# Patient Record
Sex: Male | Born: 1950 | Race: White | Hispanic: No | Marital: Married | Smoking: Never smoker
Health system: Southern US, Community
[De-identification: ages and names within clinical notes are randomized; demographics above are authoritative.]

## PROBLEM LIST (undated history)

## (undated) DIAGNOSIS — R29898 Other symptoms and signs involving the musculoskeletal system: Secondary | ICD-10-CM

## (undated) DIAGNOSIS — N400 Enlarged prostate without lower urinary tract symptoms: Secondary | ICD-10-CM

## (undated) DIAGNOSIS — I10 Essential (primary) hypertension: Secondary | ICD-10-CM

## (undated) DIAGNOSIS — C801 Malignant (primary) neoplasm, unspecified: Secondary | ICD-10-CM

## (undated) DIAGNOSIS — Z950 Presence of cardiac pacemaker: Secondary | ICD-10-CM

## (undated) DIAGNOSIS — R55 Syncope and collapse: Secondary | ICD-10-CM

## (undated) HISTORY — PX: INSERT / REPLACE / REMOVE PACEMAKER: SUR710

## (undated) HISTORY — PX: CARDIAC SURGERY: SHX584

## (undated) HISTORY — PX: HERNIA REPAIR: SHX51

## (undated) HISTORY — PX: OTHER SURGICAL HISTORY: SHX169

---

## 2007-06-04 ENCOUNTER — Ambulatory Visit: Payer: Self-pay | Admitting: Gastroenterology

## 2014-07-23 DIAGNOSIS — H2513 Age-related nuclear cataract, bilateral: Secondary | ICD-10-CM | POA: Diagnosis not present

## 2014-08-26 DIAGNOSIS — Z125 Encounter for screening for malignant neoplasm of prostate: Secondary | ICD-10-CM | POA: Diagnosis not present

## 2014-08-26 DIAGNOSIS — R7309 Other abnormal glucose: Secondary | ICD-10-CM | POA: Diagnosis not present

## 2014-08-26 DIAGNOSIS — I10 Essential (primary) hypertension: Secondary | ICD-10-CM | POA: Diagnosis not present

## 2014-09-01 DIAGNOSIS — L570 Actinic keratosis: Secondary | ICD-10-CM | POA: Diagnosis not present

## 2014-09-01 DIAGNOSIS — D485 Neoplasm of uncertain behavior of skin: Secondary | ICD-10-CM | POA: Diagnosis not present

## 2014-09-01 DIAGNOSIS — L218 Other seborrheic dermatitis: Secondary | ICD-10-CM | POA: Diagnosis not present

## 2014-09-01 DIAGNOSIS — Z85828 Personal history of other malignant neoplasm of skin: Secondary | ICD-10-CM | POA: Diagnosis not present

## 2014-09-01 DIAGNOSIS — L821 Other seborrheic keratosis: Secondary | ICD-10-CM | POA: Diagnosis not present

## 2014-09-02 DIAGNOSIS — Z86010 Personal history of colonic polyps: Secondary | ICD-10-CM | POA: Diagnosis not present

## 2014-09-02 DIAGNOSIS — I10 Essential (primary) hypertension: Secondary | ICD-10-CM | POA: Diagnosis not present

## 2014-09-02 DIAGNOSIS — Z9181 History of falling: Secondary | ICD-10-CM | POA: Diagnosis not present

## 2014-09-02 DIAGNOSIS — R7309 Other abnormal glucose: Secondary | ICD-10-CM | POA: Diagnosis not present

## 2014-09-02 DIAGNOSIS — Z1389 Encounter for screening for other disorder: Secondary | ICD-10-CM | POA: Diagnosis not present

## 2014-09-02 DIAGNOSIS — M542 Cervicalgia: Secondary | ICD-10-CM | POA: Diagnosis not present

## 2015-01-23 DIAGNOSIS — Z09 Encounter for follow-up examination after completed treatment for conditions other than malignant neoplasm: Secondary | ICD-10-CM | POA: Diagnosis not present

## 2015-01-23 DIAGNOSIS — Z86010 Personal history of colonic polyps: Secondary | ICD-10-CM | POA: Diagnosis not present

## 2015-01-23 DIAGNOSIS — D123 Benign neoplasm of transverse colon: Secondary | ICD-10-CM | POA: Diagnosis not present

## 2015-01-23 DIAGNOSIS — D126 Benign neoplasm of colon, unspecified: Secondary | ICD-10-CM | POA: Diagnosis not present

## 2015-01-23 DIAGNOSIS — D122 Benign neoplasm of ascending colon: Secondary | ICD-10-CM | POA: Diagnosis not present

## 2015-01-23 DIAGNOSIS — K5730 Diverticulosis of large intestine without perforation or abscess without bleeding: Secondary | ICD-10-CM | POA: Diagnosis not present

## 2015-01-23 DIAGNOSIS — D124 Benign neoplasm of descending colon: Secondary | ICD-10-CM | POA: Diagnosis not present

## 2015-03-27 DIAGNOSIS — I10 Essential (primary) hypertension: Secondary | ICD-10-CM | POA: Diagnosis not present

## 2015-03-27 DIAGNOSIS — R7303 Prediabetes: Secondary | ICD-10-CM | POA: Diagnosis not present

## 2015-04-01 DIAGNOSIS — R7303 Prediabetes: Secondary | ICD-10-CM | POA: Diagnosis not present

## 2015-04-01 DIAGNOSIS — Z5329 Procedure and treatment not carried out because of patient's decision for other reasons: Secondary | ICD-10-CM | POA: Diagnosis not present

## 2015-04-01 DIAGNOSIS — Z9189 Other specified personal risk factors, not elsewhere classified: Secondary | ICD-10-CM | POA: Diagnosis not present

## 2015-04-01 DIAGNOSIS — I10 Essential (primary) hypertension: Secondary | ICD-10-CM | POA: Diagnosis not present

## 2015-04-01 DIAGNOSIS — Z9181 History of falling: Secondary | ICD-10-CM | POA: Diagnosis not present

## 2015-04-01 DIAGNOSIS — Z23 Encounter for immunization: Secondary | ICD-10-CM | POA: Diagnosis not present

## 2015-04-01 DIAGNOSIS — Z1389 Encounter for screening for other disorder: Secondary | ICD-10-CM | POA: Diagnosis not present

## 2015-04-01 DIAGNOSIS — Z139 Encounter for screening, unspecified: Secondary | ICD-10-CM | POA: Diagnosis not present

## 2015-04-15 DIAGNOSIS — L570 Actinic keratosis: Secondary | ICD-10-CM | POA: Diagnosis not present

## 2015-04-15 DIAGNOSIS — D1801 Hemangioma of skin and subcutaneous tissue: Secondary | ICD-10-CM | POA: Diagnosis not present

## 2015-04-15 DIAGNOSIS — Z85828 Personal history of other malignant neoplasm of skin: Secondary | ICD-10-CM | POA: Diagnosis not present

## 2015-04-15 DIAGNOSIS — L821 Other seborrheic keratosis: Secondary | ICD-10-CM | POA: Diagnosis not present

## 2015-04-15 DIAGNOSIS — D045 Carcinoma in situ of skin of trunk: Secondary | ICD-10-CM | POA: Diagnosis not present

## 2015-04-29 DIAGNOSIS — D045 Carcinoma in situ of skin of trunk: Secondary | ICD-10-CM | POA: Diagnosis not present

## 2015-10-01 DIAGNOSIS — I10 Essential (primary) hypertension: Secondary | ICD-10-CM | POA: Diagnosis not present

## 2015-10-01 DIAGNOSIS — Z125 Encounter for screening for malignant neoplasm of prostate: Secondary | ICD-10-CM | POA: Diagnosis not present

## 2015-10-01 DIAGNOSIS — R7303 Prediabetes: Secondary | ICD-10-CM | POA: Diagnosis not present

## 2015-10-07 DIAGNOSIS — Z9189 Other specified personal risk factors, not elsewhere classified: Secondary | ICD-10-CM | POA: Diagnosis not present

## 2015-10-07 DIAGNOSIS — R7303 Prediabetes: Secondary | ICD-10-CM | POA: Diagnosis not present

## 2015-10-07 DIAGNOSIS — I10 Essential (primary) hypertension: Secondary | ICD-10-CM | POA: Diagnosis not present

## 2015-10-07 DIAGNOSIS — Z5329 Procedure and treatment not carried out because of patient's decision for other reasons: Secondary | ICD-10-CM | POA: Diagnosis not present

## 2015-10-07 DIAGNOSIS — Z6828 Body mass index (BMI) 28.0-28.9, adult: Secondary | ICD-10-CM | POA: Diagnosis not present

## 2015-11-03 DIAGNOSIS — Z85828 Personal history of other malignant neoplasm of skin: Secondary | ICD-10-CM | POA: Diagnosis not present

## 2015-11-03 DIAGNOSIS — L570 Actinic keratosis: Secondary | ICD-10-CM | POA: Diagnosis not present

## 2015-11-03 DIAGNOSIS — L821 Other seborrheic keratosis: Secondary | ICD-10-CM | POA: Diagnosis not present

## 2015-12-17 DIAGNOSIS — Z6828 Body mass index (BMI) 28.0-28.9, adult: Secondary | ICD-10-CM | POA: Diagnosis not present

## 2015-12-17 DIAGNOSIS — M545 Low back pain: Secondary | ICD-10-CM | POA: Diagnosis not present

## 2016-04-08 DIAGNOSIS — I10 Essential (primary) hypertension: Secondary | ICD-10-CM | POA: Diagnosis not present

## 2016-04-11 ENCOUNTER — Encounter: Payer: Self-pay | Admitting: Emergency Medicine

## 2016-04-11 ENCOUNTER — Emergency Department
Admission: EM | Admit: 2016-04-11 | Discharge: 2016-04-11 | Disposition: A | Discharge status: Home / Self Care | Payer: PPO | Attending: Emergency Medicine | Admitting: Emergency Medicine

## 2016-04-11 DIAGNOSIS — S0083XA Contusion of other part of head, initial encounter: Secondary | ICD-10-CM | POA: Diagnosis not present

## 2016-04-11 DIAGNOSIS — Y9389 Activity, other specified: Secondary | ICD-10-CM | POA: Diagnosis not present

## 2016-04-11 DIAGNOSIS — Y990 Civilian activity done for income or pay: Secondary | ICD-10-CM | POA: Insufficient documentation

## 2016-04-11 DIAGNOSIS — R55 Syncope and collapse: Secondary | ICD-10-CM | POA: Diagnosis not present

## 2016-04-11 DIAGNOSIS — Z79899 Other long term (current) drug therapy: Secondary | ICD-10-CM | POA: Diagnosis not present

## 2016-04-11 DIAGNOSIS — I10 Essential (primary) hypertension: Secondary | ICD-10-CM | POA: Diagnosis not present

## 2016-04-11 DIAGNOSIS — W07XXXA Fall from chair, initial encounter: Secondary | ICD-10-CM | POA: Insufficient documentation

## 2016-04-11 DIAGNOSIS — S0990XA Unspecified injury of head, initial encounter: Secondary | ICD-10-CM | POA: Diagnosis not present

## 2016-04-11 DIAGNOSIS — Y929 Unspecified place or not applicable: Secondary | ICD-10-CM | POA: Diagnosis not present

## 2016-04-11 HISTORY — DX: Essential (primary) hypertension: I10

## 2016-04-11 LAB — BASIC METABOLIC PANEL
Anion gap: 11 (ref 5–15)
BUN: 18 mg/dL (ref 6–20)
CALCIUM: 9.7 mg/dL (ref 8.9–10.3)
CO2: 24 mmol/L (ref 22–32)
CREATININE: 0.95 mg/dL (ref 0.61–1.24)
Chloride: 97 mmol/L — ABNORMAL LOW (ref 101–111)
GFR calc Af Amer: >60 mL/min (ref >60)
GFR calc non Af Amer: >60 mL/min (ref >60)
GLUCOSE: 156 mg/dL — AB (ref 65–99)
Potassium: 3.7 mmol/L (ref 3.5–5.1)
Sodium: 132 mmol/L — ABNORMAL LOW (ref 135–145)

## 2016-04-11 LAB — CBC
HCT: 43 % (ref 40.0–52.0)
Hemoglobin: 15.1 g/dL (ref 13.0–18.0)
MCH: 33.9 pg (ref 26.0–34.0)
MCHC: 35.2 g/dL (ref 32.0–36.0)
MCV: 96.4 fL (ref 80.0–100.0)
PLATELETS: 243 10*3/uL (ref 150–440)
RBC: 4.46 MIL/uL (ref 4.40–5.90)
RDW: 13 % (ref 11.5–14.5)
WBC: 12.8 10*3/uL — ABNORMAL HIGH (ref 3.8–10.6)

## 2016-04-11 NOTE — ED Triage Notes (Signed)
Patient was sitting at his computer desk, vision got "blurred", hit key pad with head and fell to floor.  Abrasion to forehead.

## 2016-04-11 NOTE — ED Provider Notes (Signed)
[facility] Emergency Department Provider Note  ____________________________________________  Time seen: Approximately 4:43 PM  I have reviewed the triage vital signs and the nursing notes.   HISTORY  Chief Complaint Loss of Consciousness    HPI Cody Gonzales is a 65 y.o. male who is in his usual state of health when today around noon he had turned in his chair to work on his computer and suddenly his vision got blurry and he got lightheaded and passed out. He fell from the chair, hitting his forehead on the keyboard tray in the process, woke up after just a few seconds. He denies any preceding symptoms other than the blurry vision. No severe headache chest pain back pain abdominal pain or shortness of breath. Pain afterward. Denies any headache or vision changes right now. No neck pain numbness tingling or weakness. He has a history of syncope from blood draws or elbow injuries past. Has a follow-up with  his primary care doctor in 2 days.     Past Medical History:  Diagnosis Date  . Hypertension      There are no active problems to display for this patient.    Past Surgical History:  Procedure Laterality Date  . CARDIAC SURGERY    . carpel tunnel       Prior to Admission medications   Medication Sig Start Date End Date Taking? Authorizing Provider  losartan-hydrochlorothiazide (HYZAAR) 100-25 MG tablet Take 1 tablet by mouth every morning. 04/07/16  Yes Historical Provider, MD     Allergies Review of patient's allergies indicates no known allergies.   No family history on file.  Social History Social History  Substance Use Topics  . Smoking status: Never Smoker  . Smokeless tobacco: Never Used  . Alcohol use Yes     Comment: weekly    Review of Systems  Constitutional:   No fever or chills.  ENT:   No sore throat. No rhinorrhea. Cardiovascular:   No chest pain. Respiratory:   No dyspnea or cough. Gastrointestinal:   Negative for  abdominal pain, vomiting and diarrhea.   Musculoskeletal:   Negative for focal pain or swelling Neurological:   Negative for headaches 10-point ROS otherwise negative.  ____________________________________________   PHYSICAL EXAM:  VITAL SIGNS: ED Triage Vitals  Enc Vitals Group     BP 04/11/16 1335 140/76     Pulse Rate 04/11/16 1335 88     Resp 04/11/16 1335 18     Temp 04/11/16 1335 98.5 F (36.9 C)     Temp Source 04/11/16 1335 Oral     SpO2 04/11/16 1335 98 %     Weight 04/11/16 1336 175 lb (79.4 kg)     Height 04/11/16 1336 5\' 6"  (1.676 m)     Head Circumference --      Peak Flow --      Pain Score 04/11/16 1349 1     Pain Loc --      Pain Edu? --      Excl. in [place]? --     Vital signs reviewed, nursing assessments reviewed.   Constitutional:   Alert and oriented. Well appearing and in no distress. Eyes:   No scleral icterus. No conjunctival pallor. PERRL. EOMI.  No nystagmus. ENT   Head:   Normocephalic with midforehead contusion. No laceration..   Nose:   No congestion/rhinnorhea. No septal hematoma   Mouth/Throat:   MMM, no pharyngeal erythema. No peritonsillar mass.    Neck:   No stridor.  No SubQ emphysema. No meningismus. Hematological/Lymphatic/Immunilogical:   No cervical lymphadenopathy. Cardiovascular:   RRR. Symmetric bilateral radial and DP pulses.  No murmurs.  Respiratory:   Normal respiratory effort without tachypnea nor retractions. Breath sounds are clear and equal bilaterally. No wheezes/rales/rhonchi. Gastrointestinal:   Soft and nontender. Non distended. There is no CVA tenderness.  No rebound, rigidity, or guarding. Genitourinary:   deferred Musculoskeletal:   Nontender with normal range of motion in all extremities. No joint effusions.  No lower extremity tenderness.  No edema. Neurologic:   Normal speech and language.  CN 2-10 normal. Motor grossly intact. No gross focal neurologic deficits are appreciated.  Skin:    Skin is  warm, dry and intact. No rash noted.  No petechiae, purpura, or bullae.  ____________________________________________    LABS (pertinent positives/negatives) (all labs ordered are listed, but only abnormal results are displayed) Labs Reviewed  BASIC METABOLIC PANEL - Abnormal; Notable for the following:       Result Value   Sodium 132 (*)    Chloride 97 (*)    Glucose, Bld 156 (*)    All other components within normal limits  CBC - Abnormal; Notable for the following:    WBC 12.8 (*)    All other components within normal limits  URINALYSIS COMPLETEWITH MICROSCOPIC ([facility] ONLY)  CBG MONITORING, ED   ____________________________________________   EKG  Interpreted by me Sinus rhythm rate of 82, left axis. First-degree AV block with PR interval 226 ms. Right bundle branch block. Normal ST segments and T waves. No acute ischemic changes.  ____________________________________________    RADIOLOGY    ____________________________________________   PROCEDURES Procedures  ____________________________________________   INITIAL IMPRESSION / ASSESSMENT AND PLAN / ED COURSE  Pertinent labs & imaging results that were available during my care of the patient were reviewed by me and considered in my medical decision making (see chart for details).  Patient well appearing no acute distress. Presents with syncope, likely vasovagal. No red flag symptoms before or afterward. Normal vital signs, unremarkable EKG. Not on blood thinners. No severe comorbidities. Follow-up with primary care as scheduled in 2 days. May be related to mild dehydration as he has been working in the yard for the last couple days aerating and seeding the lawn. No symptoms with exertion.  Considering the patient's symptoms, medical history, and physical examination today, I have low suspicion for ischemic stroke, intracranial hemorrhage, meningitis, encephalitis, carotid or vertebral dissection, venous sinus  thrombosis, MS, intracranial hypertension, glaucoma, CRAO, CRVO, or temporal arteritis. Low suspicion of AAA dissection PE ACS   Clinical Course   ____________________________________________   FINAL CLINICAL IMPRESSION(S) / ED DIAGNOSES  Final diagnoses:  Vasovagal syncope  Forehead contusion     Portions of this note were generated with dragon dictation software. Dictation errors may occur despite best attempts at proofreading.    Carrie Mew, MD 04/11/16 [phone]

## 2016-04-13 DIAGNOSIS — I10 Essential (primary) hypertension: Secondary | ICD-10-CM | POA: Diagnosis not present

## 2016-04-13 DIAGNOSIS — R7303 Prediabetes: Secondary | ICD-10-CM | POA: Diagnosis not present

## 2016-04-13 DIAGNOSIS — Z6828 Body mass index (BMI) 28.0-28.9, adult: Secondary | ICD-10-CM | POA: Diagnosis not present

## 2016-04-13 DIAGNOSIS — R55 Syncope and collapse: Secondary | ICD-10-CM | POA: Diagnosis not present

## 2016-04-13 DIAGNOSIS — Z23 Encounter for immunization: Secondary | ICD-10-CM | POA: Diagnosis not present

## 2016-04-20 DIAGNOSIS — Z23 Encounter for immunization: Secondary | ICD-10-CM | POA: Diagnosis not present

## 2016-04-20 DIAGNOSIS — Z9181 History of falling: Secondary | ICD-10-CM | POA: Diagnosis not present

## 2016-04-20 DIAGNOSIS — Z1389 Encounter for screening for other disorder: Secondary | ICD-10-CM | POA: Diagnosis not present

## 2016-04-20 DIAGNOSIS — R001 Bradycardia, unspecified: Secondary | ICD-10-CM | POA: Diagnosis not present

## 2016-04-20 DIAGNOSIS — I10 Essential (primary) hypertension: Secondary | ICD-10-CM | POA: Diagnosis not present

## 2016-04-20 DIAGNOSIS — E663 Overweight: Secondary | ICD-10-CM | POA: Diagnosis not present

## 2016-04-20 DIAGNOSIS — R55 Syncope and collapse: Secondary | ICD-10-CM | POA: Diagnosis not present

## 2016-04-29 DIAGNOSIS — E782 Mixed hyperlipidemia: Secondary | ICD-10-CM | POA: Insufficient documentation

## 2016-04-29 DIAGNOSIS — I10 Essential (primary) hypertension: Secondary | ICD-10-CM | POA: Diagnosis not present

## 2016-04-29 DIAGNOSIS — R55 Syncope and collapse: Secondary | ICD-10-CM | POA: Diagnosis not present

## 2016-05-04 DIAGNOSIS — R55 Syncope and collapse: Secondary | ICD-10-CM | POA: Diagnosis not present

## 2016-05-06 DIAGNOSIS — J069 Acute upper respiratory infection, unspecified: Secondary | ICD-10-CM | POA: Diagnosis not present

## 2016-05-06 DIAGNOSIS — J209 Acute bronchitis, unspecified: Secondary | ICD-10-CM | POA: Diagnosis not present

## 2016-05-17 DIAGNOSIS — Z85828 Personal history of other malignant neoplasm of skin: Secondary | ICD-10-CM | POA: Diagnosis not present

## 2016-05-17 DIAGNOSIS — L821 Other seborrheic keratosis: Secondary | ICD-10-CM | POA: Diagnosis not present

## 2016-05-17 DIAGNOSIS — L570 Actinic keratosis: Secondary | ICD-10-CM | POA: Diagnosis not present

## 2016-05-25 DIAGNOSIS — R55 Syncope and collapse: Secondary | ICD-10-CM | POA: Diagnosis not present

## 2016-05-26 DIAGNOSIS — E782 Mixed hyperlipidemia: Secondary | ICD-10-CM | POA: Diagnosis not present

## 2016-05-26 DIAGNOSIS — I10 Essential (primary) hypertension: Secondary | ICD-10-CM | POA: Diagnosis not present

## 2016-05-26 DIAGNOSIS — R55 Syncope and collapse: Secondary | ICD-10-CM | POA: Diagnosis not present

## 2016-06-09 DIAGNOSIS — H35342 Macular cyst, hole, or pseudohole, left eye: Secondary | ICD-10-CM | POA: Diagnosis not present

## 2016-06-09 DIAGNOSIS — H11153 Pinguecula, bilateral: Secondary | ICD-10-CM | POA: Diagnosis not present

## 2016-06-09 DIAGNOSIS — H40013 Open angle with borderline findings, low risk, bilateral: Secondary | ICD-10-CM | POA: Diagnosis not present

## 2016-06-09 DIAGNOSIS — H40003 Preglaucoma, unspecified, bilateral: Secondary | ICD-10-CM | POA: Diagnosis not present

## 2016-06-09 DIAGNOSIS — H18413 Arcus senilis, bilateral: Secondary | ICD-10-CM | POA: Diagnosis not present

## 2016-06-09 DIAGNOSIS — Z961 Presence of intraocular lens: Secondary | ICD-10-CM | POA: Diagnosis not present

## 2016-06-09 DIAGNOSIS — H5213 Myopia, bilateral: Secondary | ICD-10-CM | POA: Diagnosis not present

## 2016-06-09 DIAGNOSIS — H52223 Regular astigmatism, bilateral: Secondary | ICD-10-CM | POA: Diagnosis not present

## 2016-06-28 DIAGNOSIS — R001 Bradycardia, unspecified: Secondary | ICD-10-CM | POA: Diagnosis not present

## 2016-06-28 DIAGNOSIS — R55 Syncope and collapse: Secondary | ICD-10-CM | POA: Diagnosis not present

## 2016-06-28 DIAGNOSIS — R42 Dizziness and giddiness: Secondary | ICD-10-CM | POA: Diagnosis not present

## 2016-07-03 ENCOUNTER — Inpatient Hospital Stay: Payer: PPO

## 2016-07-03 ENCOUNTER — Inpatient Hospital Stay
Admission: EM | Admit: 2016-07-03 | Discharge: 2016-07-05 | DRG: 244 | Disposition: A | Discharge status: Home / Self Care | Payer: PPO | Attending: Internal Medicine | Admitting: Internal Medicine

## 2016-07-03 ENCOUNTER — Encounter: Payer: Self-pay | Admitting: *Deleted

## 2016-07-03 ENCOUNTER — Emergency Department: Payer: PPO

## 2016-07-03 DIAGNOSIS — I442 Atrioventricular block, complete: Principal | ICD-10-CM | POA: Diagnosis present

## 2016-07-03 DIAGNOSIS — R55 Syncope and collapse: Secondary | ICD-10-CM | POA: Diagnosis not present

## 2016-07-03 DIAGNOSIS — I445 Left posterior fascicular block: Secondary | ICD-10-CM | POA: Diagnosis not present

## 2016-07-03 DIAGNOSIS — I10 Essential (primary) hypertension: Secondary | ICD-10-CM | POA: Diagnosis present

## 2016-07-03 DIAGNOSIS — Z950 Presence of cardiac pacemaker: Secondary | ICD-10-CM | POA: Diagnosis not present

## 2016-07-03 DIAGNOSIS — R001 Bradycardia, unspecified: Secondary | ICD-10-CM | POA: Diagnosis not present

## 2016-07-03 DIAGNOSIS — Z7982 Long term (current) use of aspirin: Secondary | ICD-10-CM | POA: Diagnosis not present

## 2016-07-03 DIAGNOSIS — I4510 Unspecified right bundle-branch block: Secondary | ICD-10-CM | POA: Diagnosis present

## 2016-07-03 DIAGNOSIS — Z8679 Personal history of other diseases of the circulatory system: Secondary | ICD-10-CM

## 2016-07-03 DIAGNOSIS — R0789 Other chest pain: Secondary | ICD-10-CM | POA: Diagnosis not present

## 2016-07-03 HISTORY — DX: Syncope and collapse: R55

## 2016-07-03 LAB — COMPREHENSIVE METABOLIC PANEL
ALT: 31 U/L (ref 17–63)
AST: 26 U/L (ref 15–41)
Albumin: 3.8 g/dL (ref 3.5–5.0)
Alkaline Phosphatase: 67 U/L (ref 38–126)
Anion gap: 6 (ref 5–15)
BILIRUBIN TOTAL: 1.2 mg/dL (ref 0.3–1.2)
BUN: 17 mg/dL (ref 6–20)
CALCIUM: 8.4 mg/dL — AB (ref 8.9–10.3)
CHLORIDE: 109 mmol/L (ref 101–111)
CO2: 25 mmol/L (ref 22–32)
CREATININE: 0.69 mg/dL (ref 0.61–1.24)
Glucose, Bld: 102 mg/dL — ABNORMAL HIGH (ref 65–99)
Potassium: 3.3 mmol/L — ABNORMAL LOW (ref 3.5–5.1)
Sodium: 140 mmol/L (ref 135–145)
TOTAL PROTEIN: 6.7 g/dL (ref 6.5–8.1)

## 2016-07-03 LAB — GLUCOSE, CAPILLARY: GLUCOSE-CAPILLARY: 104 mg/dL — AB (ref 65–99)

## 2016-07-03 LAB — TROPONIN I

## 2016-07-03 MED ORDER — AMLODIPINE BESYLATE 5 MG PO TABS
5.0000 mg | ORAL_TABLET | Freq: Two times a day (BID) | ORAL | Status: DC | PRN
  Administered 2016-07-04: 5 mg via ORAL
  Filled 2016-07-03: qty 1

## 2016-07-03 MED ORDER — ACETAMINOPHEN 325 MG PO TABS: 650.0000 mg | ORAL_TABLET | Freq: Four times a day (QID) | ORAL | Status: DC | PRN

## 2016-07-03 MED ORDER — LOSARTAN POTASSIUM 25 MG PO TABS
100.0000 mg | ORAL_TABLET | Freq: Every day | ORAL | Status: DC
  Administered 2016-07-05: 100 mg via ORAL
  Filled 2016-07-03: qty 4

## 2016-07-03 MED ORDER — SODIUM CHLORIDE 0.9% FLUSH: 3.0000 mL | INTRAVENOUS | Status: DC | PRN

## 2016-07-03 MED ORDER — SODIUM CHLORIDE 0.9 % IV SOLN: 250.0000 mL | INTRAVENOUS | Status: DC | PRN

## 2016-07-03 MED ORDER — ONDANSETRON HCL 4 MG PO TABS: 4.0000 mg | ORAL_TABLET | Freq: Four times a day (QID) | ORAL | Status: DC | PRN

## 2016-07-03 MED ORDER — HYDROCHLOROTHIAZIDE 25 MG PO TABS
25.0000 mg | ORAL_TABLET | Freq: Every day | ORAL | Status: DC
  Administered 2016-07-05: 25 mg via ORAL
  Filled 2016-07-03: qty 1

## 2016-07-03 MED ORDER — CHLORHEXIDINE GLUCONATE 4 % EX LIQD
60.0000 mL | Freq: Once | CUTANEOUS | Status: AC
  Administered 2016-07-04: 4 via TOPICAL

## 2016-07-03 MED ORDER — SODIUM CHLORIDE 0.9% FLUSH
3.0000 mL | Freq: Two times a day (BID) | INTRAVENOUS | Status: DC
  Administered 2016-07-03 – 2016-07-04 (×3): 3 mL via INTRAVENOUS

## 2016-07-03 MED ORDER — ONDANSETRON HCL 4 MG/2ML IJ SOLN: 4.0000 mg | Freq: Four times a day (QID) | INTRAMUSCULAR | Status: DC | PRN

## 2016-07-03 MED ORDER — ASPIRIN EC 81 MG PO TBEC
81.0000 mg | DELAYED_RELEASE_TABLET | Freq: Every day | ORAL | Status: DC
  Administered 2016-07-05: 81 mg via ORAL
  Filled 2016-07-03: qty 1

## 2016-07-03 MED ORDER — CHLORHEXIDINE GLUCONATE 4 % EX LIQD
60.0000 mL | Freq: Once | CUTANEOUS | Status: AC
Start: 2016-07-03 — End: 2016-07-03
  Administered 2016-07-03: 4 via TOPICAL

## 2016-07-03 MED ORDER — ACETAMINOPHEN 650 MG RE SUPP: 650.0000 mg | Freq: Four times a day (QID) | RECTAL | Status: DC | PRN

## 2016-07-03 MED ORDER — LOSARTAN POTASSIUM-HCTZ 100-25 MG PO TABS: 1.0000 | ORAL_TABLET | ORAL | Status: DC

## 2016-07-03 NOTE — ED Notes (Signed)
ICU called for bed readiness. A patient is being moved out of the room and it will be cleaned before this patient can be admitted.

## 2016-07-03 NOTE — ED Notes (Addendum)
Patient was raised up for chest x-ray and lost capture again. Patient c/o dizzy when heart rate is in the 30's during loss of capture.

## 2016-07-03 NOTE — ED Triage Notes (Signed)
Patient has c/o several episodes of syncope today and has a history of bradycardia. Patient has seen a cardiologist.

## 2016-07-03 NOTE — ED Notes (Signed)
Patient is consistently being paced at this time. Dr. Clayton Bibles is aware.

## 2016-07-03 NOTE — ED Notes (Signed)
Patient's head was raised to 30 degrees. Patient sat up and keeping right leg straight moved up on the stretcher. Patient's denies pain, lost capture on monitor. Dr. Clayton Bibles was paged.

## 2016-07-03 NOTE — ED Provider Notes (Signed)
[facility] Emergency Department Provider Note ____________________________________________   I have reviewed the triage vital signs and the triage nursing note.  HISTORY  Chief Complaint Bradycardia   Historian Somewhat limited due to critical illness, history from wife and Patient  HPI Cody Gonzales is a 66 y.o. male on Hyzaar for hypertension, presenting today after approximately 4 syncopal episodes today. He gets lightheaded and then passed out. No chest pain. No trouble breathing. States that he's been having "issues" with his heart and has been following with Dr. Nehemiah Massed.  No ongoing chest pain or trouble breathing. No recent fevers.    Past Medical History:  Diagnosis Date  . Hypertension   . Syncope     There are no active problems to display for this patient.   Past Surgical History:  Procedure Laterality Date  . CARDIAC SURGERY    . carpel tunnel      Prior to Admission medications   Medication Sig Start Date End Date Taking? Authorizing Provider  losartan-hydrochlorothiazide (HYZAAR) 100-25 MG tablet Take 1 tablet by mouth every morning. 04/07/16   Historical Provider, MD    No Known Allergies  No family history on file.  Social History Social History  Substance Use Topics  . Smoking status: Never Smoker  . Smokeless tobacco: Never Used  . Alcohol use Yes     Comment: weekly    Review of Systems  Constitutional: Negative for fever. Eyes: Negative for visual changes. ENT: Negative for sore throat. Cardiovascular: Negative for chest pain. Respiratory: Negative for shortness of breath. Gastrointestinal: Negative for abdominal pain, vomiting and diarrhea. Genitourinary: Negative for dysuria. Musculoskeletal: Negative for back pain. Skin: Negative for rash. Neurological: Negative for headache. 10 point Review of Systems otherwise negative ____________________________________________   PHYSICAL EXAM:  VITAL SIGNS: ED  Triage Vitals  Enc Vitals Group     BP 07/03/16 1758 107/83     Pulse Rate 07/03/16 1758 (!) 30     Resp 07/03/16 1758 16     Temp 07/03/16 1758 97.8 F (36.6 C)     Temp Source 07/03/16 1758 Oral     SpO2 07/03/16 1758 98 %     Weight 07/03/16 1807 175 lb (79.4 kg)     Height 07/03/16 1807 5\' 6"  (1.676 m)     Head Circumference --      Peak Flow --      Pain Score --      Pain Loc --      Pain Edu? --      Excl. in [place]? --      Constitutional: Alert and oriented. Well appearing and in no distress. HEENT   Head: Normocephalic and atraumatic.      Eyes: Conjunctivae are normal. PERRL. Normal extraocular movements.      Ears:         Nose: No congestion/rhinnorhea.   Mouth/Throat: Mucous membranes are moist.   Neck: No stridor. Cardiovascular/Chest: Bradycardic and irregular.  No murmurs, rubs, or gallops. Respiratory: Normal respiratory effort without tachypnea nor retractions. Breath sounds are clear and equal bilaterally. No wheezes/rales/rhonchi. Gastrointestinal: Soft. No distention, no guarding, no rebound. Nontender.   Genitourinary/rectal:Deferred Musculoskeletal: Nontender with normal range of motion in all extremities. No joint effusions.  No lower extremity tenderness.  No edema. Neurologic:  Normal speech and language. No gross or focal neurologic deficits are appreciated. Skin:  Skin is warm, dry and intact. No rash noted. Psychiatric: Mood and affect are normal. Speech and behavior  are normal. Patient exhibits appropriate insight and judgment.   ____________________________________________  LABS (pertinent positives/negatives)  Labs Reviewed  COMPREHENSIVE METABOLIC PANEL  TROPONIN I  CBC WITH DIFFERENTIAL/PLATELET    ____________________________________________    EKG I, Lisa Roca, MD, the attending physician have personally viewed and interpreted all ECGs.  33 bpm. Complete heart block. Junctional rhythm  EKG #2 28 bpm complete heart  block. White count QRS. ____________________________________________  RADIOLOGY All Xrays were viewed by me. Imaging interpreted by Radiologist.  Chest x-ray post procedure - pending __________________________________________  PROCEDURES  Procedure(s) performed: None  Critical Care performed: CRITICAL CARE Performed by: Lisa Roca   Total critical care time: 30 minutes  Critical care time was exclusive of separately billable procedures and treating other patients.  Critical care was necessary to treat or prevent imminent or life-threatening deterioration.  Critical care was time spent personally by me on the following activities: development of treatment plan with patient and/or surrogate as well as nursing, discussions with consultants, evaluation of patient's response to treatment, examination of patient, obtaining history from patient or surrogate, ordering and performing treatments and interventions, ordering and review of laboratory studies, ordering and review of radiographic studies, pulse oximetry and re-evaluation of patient's condition.   ____________________________________________   ED COURSE / ASSESSMENT AND PLAN  Pertinent labs & imaging results that were available during my care of the patient were reviewed by me and considered in my medical decision making (see chart for details).  I was called to patient's bedside as patient presented due to syncope in the waiting room, for syncope today, and found to be bradycardic rhythm in the 30s. I reviewed the EKG showing complete heart block. At a heart rate varying from 20-40, patient alert and lying flat.  No history of chest pain or chest pain or shortness of breath here. No medications such as beta blockers or calcium channel blockers.  I spoke with he and his wife about the need for emergency temporary pacemaker, and consulted cardiologist to his coming down the place this.  Patient and his wife state that he  WILL NOT ACCEPT BLOOD PRODUCTS.  I did complete the consent form for Dr. Etta Quill temperature pacemaker.   CONSULTATIONS:  Dr. Clayborn Bigness, cardiology, to the bedside to placed temporary pacemaker. I spoke with both the hospitalist, Dr. Judeen Hammans and intensivist through [place] regarding admission attending service, and given patient will have primary cardiac issue addressed, will be admitted to hospitalist service in the ICU.   Patient / Family / Caregiver informed of clinical course, medical decision-making process, and agree with plan.   ___________________________________________   FINAL CLINICAL IMPRESSION(S) / ED DIAGNOSES   Final diagnoses:  Complete heart block ([place])              Note: This dictation was prepared with Dragon dictation. Any transcriptional errors that result from this process are unintentional    Lisa Roca, MD 07/03/16 [phone]

## 2016-07-03 NOTE — Progress Notes (Signed)
Pt. Arrived from ED with Temporary Pacer: rate of 50, output-0.6, sense 1.0. No c/o chest pain, dizziness, nausea at his time. R femoral site looks good, Biopatch with Tegaderm dressing in place.

## 2016-07-03 NOTE — H&P (Addendum)
[name] [place] NAME: Cody Gonzales    MR#:  [id number]  DATE OF BIRTH:  December 10, 1950  DATE OF ADMISSION:  07/03/2016  PRIMARY CARE PHYSICIAN: [facility]   REQUESTING/REFERRING PHYSICIAN:   CHIEF COMPLAINT:   Chief Complaint  Patient presents with  . Bradycardia    HISTORY OF PRESENT ILLNESS: Cody Gonzales  is a 66 y.o. male with a known history of Essential hypertension, syncopal episode since October 2017, who presents to the hospital with complaints of 2 presyncopal episodes today. Apparently patient has been evaluated by cardiologist for a few syncopal episodes, he underwent a stress test, had a Holter monitor, echocardiogram, all of them were unremarkable. Today, however, he had a few episodes of presyncopal, when he would get lightheaded and almost passed out. He presented to emergency room where he was noted to have complete heart block. Cardiology consultation was requested emergently and temporary pacemaker was placed. The patient feels good now. He admits of having had some chest tightness with episodes of presyncope earlier today. Hospitalist services were contacted for admission  PAST MEDICAL HISTORY:   Past Medical History:  Diagnosis Date  . Hypertension   . Syncope     PAST SURGICAL HISTORY:  Past Surgical History:  Procedure Laterality Date  . CARDIAC SURGERY    . carpel tunnel      SOCIAL HISTORY:  Social History  Substance Use Topics  . Smoking status: Never Smoker  . Smokeless tobacco: Never Used  . Alcohol use Yes     Comment: weekly    FAMILY HISTORY: No family history on file.  DRUG ALLERGIES: No Known Allergies  Review of Systems  Constitutional: Negative for chills, fever and weight loss.  HENT: Negative for congestion.   Eyes: Negative for blurred vision and double vision.  Respiratory: Negative for cough, sputum production, shortness of breath and wheezing.   Cardiovascular:  Positive for chest pain and palpitations. Negative for orthopnea, leg swelling and PND.  Gastrointestinal: Negative for abdominal pain, blood in stool, constipation, diarrhea, nausea and vomiting.  Genitourinary: Negative for dysuria, frequency, hematuria and urgency.  Musculoskeletal: Negative for falls.  Neurological: Positive for dizziness. Negative for tremors, focal weakness and headaches.  Endo/Heme/Allergies: Does not bruise/bleed easily.  Psychiatric/Behavioral: Negative for depression. The patient does not have insomnia.     MEDICATIONS AT HOME:  Prior to Admission medications   Medication Sig Start Date End Date Taking? Authorizing Provider  aspirin EC 81 MG tablet Take 81 mg by mouth daily.   Yes Historical Provider, MD  losartan-hydrochlorothiazide (HYZAAR) 100-25 MG tablet Take 1 tablet by mouth every morning. 04/07/16  Yes Historical Provider, MD      PHYSICAL EXAMINATION:   VITAL SIGNS: Blood pressure (!) 145/87, pulse (!) 49, temperature 97.8 F (36.6 C), temperature source Oral, resp. rate 16, height 5\' 6"  (1.676 m), weight 79.4 kg (175 lb), SpO2 97 %.  GENERAL:  66 y.o.-year-old patient lying in the bed with no acute distress.  EYES: Pupils equal, round, reactive to light and accommodation. No scleral icterus. Extraocular muscles intact.  HEENT: Head atraumatic, normocephalic. Oropharynx and nasopharynx clear.  NECK:  Supple, no jugular venous distention. No thyroid enlargement, no tenderness.  LUNGS: Normal breath sounds bilaterally, no wheezing, rales,rhonchi or crepitation. No use of accessory muscles of respiration.  CARDIOVASCULAR: S1, S2 normal. No murmurs, rubs, or gallops.  ABDOMEN: Soft, nontender, nondistended. Bowel sounds present. No organomegaly or mass.  EXTREMITIES: No  pedal edema, cyanosis, or clubbing.  NEUROLOGIC: Cranial nerves II through XII are intact. Muscle strength 5/5 in all extremities. Sensation intact. Gait not checked.  PSYCHIATRIC: The  patient is alert and oriented x 3.  SKIN: No obvious rash, lesion, or ulcer.   LABORATORY PANEL:   CBC No results for input(s): WBC, HGB, HCT, PLT, MCV, MCH, MCHC, RDW, LYMPHSABS, MONOABS, EOSABS, BASOSABS, BANDABS in the last 168 hours.  Invalid input(s): NEUTRABS, BANDSABD ------------------------------------------------------------------------------------------------------------------  Chemistries  No results for input(s): NA, K, CL, CO2, GLUCOSE, BUN, CREATININE, CALCIUM, MG, AST, ALT, ALKPHOS, BILITOT in the last 168 hours.  Invalid input(s): GFRCGP ------------------------------------------------------------------------------------------------------------------  Cardiac Enzymes No results for input(s): TROPONINI in the last 168 hours. ------------------------------------------------------------------------------------------------------------------  RADIOLOGY: No results found.  EKG: Orders placed or performed during the hospital encounter of 04/11/16  . ED EKG  . ED EKG  . EKG 12-Lead  . EKG 12-Lead  . EKG   EKG in emergency room revealed complete AV block with wide QRS complexes, rate of 28 bpm, multiple ventricular premature complexes, right bundle branch block and left posterior fascicular block, no acute ST-T changes   IMPRESSION AND PLAN:  Principal Problem:   Complete heart block (HCC) Active Problems:   Near syncope   Essential hypertension   History of temporary cardiac pacemaker treatment  #1. Complete heart block, status post temporary cardiac pacemaker placement by Dr. Clayborn Bigness January 21st 2018, admit patient to medical floor, intensive care unit, cardiology consultation will be requested for permanent pacemaker placement #2. Chest pressure, follow cardiac enzymes 3 #3. Near-syncope, likely due to complete heart block, follow closely  . Get chest x-ray, labs #4. Essential hypertension, continue outpatient medications 50   All the records are  reviewed and case discussed with ED provider. Management plans discussed with the patient, family and they are in agreement.  CODE STATUS: Code Status History    This patient does not have a recorded code status. Please follow your organizational policy for patients in this situation.    Advance Directive Documentation   Flowsheet Row Most Recent Value  Type of Advance Directive  Healthcare Power of Attorney [place], spouse]  Pre-existing out of facility DNR order (yellow form or pink MOST form)  No data  "MOST" Form in Place?  No data       TOTAL TIME TAKING CARE OF THIS PATIENT: 50  minutes.    Theodoro Grist M.D on 07/03/2016 at 8:19 PM  Between 7am to 6pm - Pager - [phone] After 6pm go to [email or web address] - password [email or web address] [facility]  Office  [phone]  CC: Primary care physician; [facility]

## 2016-07-03 NOTE — ED Notes (Signed)
Patient's rhythm goes in and out with being paced since the chest x-ray. MD aware.

## 2016-07-03 NOTE — ED Notes (Signed)
Dr. Clearnce Hasten at bedside, applied pressure to femoral site. Patient is now in capture.

## 2016-07-03 NOTE — ED Notes (Signed)
Patient is in and out of capture at this time.

## 2016-07-03 NOTE — ED Notes (Signed)
Patient regained capture on the monitor.

## 2016-07-03 NOTE — ED Notes (Signed)
Dr. Clayborn Bigness finished with procedure. Patient tolerated procedure well. Dr. Clayborn Bigness inserted a 5Fr [place] in right femoral after numbing with lidocaine. Procedure was done under fluoro. Rate is 50, Output is 0.6, sense 1.0. Patient remains on zoll pads.

## 2016-07-03 NOTE — ED Notes (Signed)
Patient remains in good spirits. Family at bedside.

## 2016-07-03 NOTE — ED Notes (Signed)
Dr. Clayton Bibles with patient.

## 2016-07-03 NOTE — ED Notes (Signed)
Patient changed over to a fluoro bed. Consent signed by patient's wife with patient's verbal, witnessed approval. Mateo Flow RN, this writer, x-ray staff and Dr. Clayborn Bigness at bedside.

## 2016-07-03 NOTE — CV Procedure (Signed)
Procedure : Temp pacer Indication CHB/Syncope/Bradycaria Date 07/03/16 Pt was in ER after two episode of syncope today. Bp was stable but Hr was 30-40's So he came to the ER. EKG CHB rate of 36 NSSTW changes  Right groin got 10cc of 1% [place] 6Fr was inserted into R fem vein 5r pacer was floated in to RV Fluoro C arm was used in ER Pacer was programed at Rate of 50, output 0.3 sense 0.1 with good capture Pacer set at  50bpm, output 0.8, sensed 0.6 He was transferred to ICU Pt tolerated procedure well., no complications  PT Referred for [facility] 07/04/16

## 2016-07-03 NOTE — Consult Note (Signed)
Reason for Consult:CHB Bradycardia Syncope Referring Physician: Dr Reita Cliche ER Cardiologist Cody Gonzales is an 66 y.o. male.  HPI: Pt is a 66 y/o WM with multilpe episodes of syncope. He had negative ECHO/Myoiew . Holter which  Was recently wore is pending results. He had to episodes of syncope today so he came to the ER. He was found to have a Hr of 36 CHB. He denied CP SOB or palpitations. Now here for evaluation.  Past Medical History:  Diagnosis Date  . Hypertension   . Syncope     Past Surgical History:  Procedure Laterality Date  . CARDIAC SURGERY    . carpel tunnel      No family history on file.  Social History:  reports that he has never smoked. He has never used smokeless tobacco. He reports that he drinks alcohol. He reports that he does not use drugs.  Allergies: No Known Allergies  Medications: I have reviewed the patient's current medications.  Results for orders placed or performed during the hospital encounter of 07/03/16 (from the past 48 hour(s))  Troponin I     Status: None   Collection Time: 07/03/16  9:39 PM  Result Value Ref Range   Troponin I <0.03 <0.03 ng/mL  Comprehensive metabolic panel     Status: Abnormal   Collection Time: 07/03/16  9:39 PM  Result Value Ref Range   Sodium 140 135 - 145 mmol/L   Potassium 3.3 (L) 3.5 - 5.1 mmol/L   Chloride 109 101 - 111 mmol/L   CO2 25 22 - 32 mmol/L   Glucose, Bld 102 (H) 65 - 99 mg/dL   BUN 17 6 - 20 mg/dL   Creatinine, Ser 0.69 0.61 - 1.24 mg/dL   Calcium 8.4 (L) 8.9 - 10.3 mg/dL   Total Protein 6.7 6.5 - 8.1 g/dL   Albumin 3.8 3.5 - 5.0 g/dL   AST 26 15 - 41 U/L   ALT 31 17 - 63 U/L   Alkaline Phosphatase 67 38 - 126 U/L   Total Bilirubin 1.2 0.3 - 1.2 mg/dL   GFR calc non Af Amer >60 >60 mL/min   GFR calc Af Amer >60 >60 mL/min    Comment: (NOTE) The eGFR has been calculated using the CKD EPI equation. This calculation has not been validated in all clinical situations. eGFR's persistently <60  mL/min signify possible Chronic Kidney Disease.    Anion gap 6 5 - 15  Glucose, capillary     Status: Abnormal   Collection Time: 07/03/16 10:23 PM  Result Value Ref Range   Glucose-Capillary 104 (H) 65 - 99 mg/dL    Dg Chest Portable 1 View  Result Date: 07/03/2016 CLINICAL DATA:  Pacer placement EXAM: PORTABLE CHEST 1 VIEW COMPARISON:  None. FINDINGS: External pacing wires over the heart. No pneumothorax. No pulmonary edema. IMPRESSION: No pneumothorax or pulmonary edema. Electronically Signed   By: Suzy Bouchard M.D.   On: 07/03/2016 21:39    Review of Systems  Constitutional: Positive for malaise/fatigue.  HENT: Negative.   Eyes: Negative.   Respiratory: Negative.   Cardiovascular: Negative.   Gastrointestinal: Negative.   Genitourinary: Negative.   Musculoskeletal: Negative.   Skin: Negative.   Neurological: Positive for loss of consciousness.   Blood pressure (!) 134/95, pulse (!) 48, temperature 98.1 F (36.7 C), resp. rate 13, height _0  (1.676 m), weight 79.4 kg (175 lb), SpO2 96 %. Physical Exam  Nursing note and vitals reviewed. Constitutional: He  is oriented to person, place, and time. He appears well-developed and well-nourished.  HENT:  Head: Normocephalic and atraumatic.  Eyes: Conjunctivae and EOM are normal. Pupils are equal, round, and reactive to light.  Neck: Normal range of motion. Neck supple.  Cardiovascular: Normal pulses.  An irregularly irregular rhythm present. Bradycardia present.   Respiratory: Effort normal and breath sounds normal.  GI: Soft. Bowel sounds are normal.  Musculoskeletal: Normal range of motion.  Neurological: He is alert and oriented to person, place, and time. He has normal reflexes.  Skin: Skin is warm and dry.  Psychiatric: He has a normal mood and affect.    Assessment/Plan: CHB Bradycardia Syncope HTN Jehovah Witness/ No Blood products . PLAN Admit to ICU Place Temp Pacer in ER [facility] Refer for PPM with AP  for 07/04/16 L sub Clavian Continue Bp control Continue ASA therapy      Cody Gonzales D Cody Gonzales 07/03/2016, 10:32 PM

## 2016-07-04 ENCOUNTER — Inpatient Hospital Stay: Payer: PPO

## 2016-07-04 ENCOUNTER — Inpatient Hospital Stay: Payer: PPO | Admitting: Anesthesiology

## 2016-07-04 ENCOUNTER — Encounter
Admission: EM | Disposition: A | Discharge status: Home / Self Care | Payer: Self-pay | Source: Home / Self Care | Attending: Internal Medicine

## 2016-07-04 ENCOUNTER — Encounter: Payer: Self-pay | Admitting: Cardiology

## 2016-07-04 DIAGNOSIS — I442 Atrioventricular block, complete: Secondary | ICD-10-CM | POA: Diagnosis not present

## 2016-07-04 DIAGNOSIS — R55 Syncope and collapse: Secondary | ICD-10-CM | POA: Diagnosis not present

## 2016-07-04 DIAGNOSIS — R0789 Other chest pain: Secondary | ICD-10-CM | POA: Diagnosis not present

## 2016-07-04 DIAGNOSIS — I10 Essential (primary) hypertension: Secondary | ICD-10-CM | POA: Diagnosis not present

## 2016-07-04 DIAGNOSIS — R001 Bradycardia, unspecified: Secondary | ICD-10-CM | POA: Diagnosis not present

## 2016-07-04 DIAGNOSIS — Z45018 Encounter for adjustment and management of other part of cardiac pacemaker: Secondary | ICD-10-CM | POA: Diagnosis not present

## 2016-07-04 HISTORY — PX: PACEMAKER INSERTION: SHX728

## 2016-07-04 LAB — CBC
HCT: 44.3 % (ref 40.0–52.0)
HEMOGLOBIN: 15.7 g/dL (ref 13.0–18.0)
MCH: 34.5 pg — ABNORMAL HIGH (ref 26.0–34.0)
MCHC: 35.4 g/dL (ref 32.0–36.0)
MCV: 97.5 fL (ref 80.0–100.0)
PLATELETS: 224 10*3/uL (ref 150–440)
RBC: 4.55 MIL/uL (ref 4.40–5.90)
RDW: 12.8 % (ref 11.5–14.5)
WBC: 11.2 10*3/uL — ABNORMAL HIGH (ref 3.8–10.6)

## 2016-07-04 LAB — BASIC METABOLIC PANEL
ANION GAP: 8 (ref 5–15)
BUN: 18 mg/dL (ref 6–20)
CALCIUM: 9.4 mg/dL (ref 8.9–10.3)
CO2: 26 mmol/L (ref 22–32)
CREATININE: 0.77 mg/dL (ref 0.61–1.24)
Chloride: 106 mmol/L (ref 101–111)
Glucose, Bld: 114 mg/dL — ABNORMAL HIGH (ref 65–99)
Potassium: 3.5 mmol/L (ref 3.5–5.1)
Sodium: 140 mmol/L (ref 135–145)

## 2016-07-04 LAB — MRSA PCR SCREENING: MRSA by PCR: NEGATIVE

## 2016-07-04 LAB — TROPONIN I
TROPONIN I: 0.04 ng/mL — AB (ref <0.03)
Troponin I: 0.03 ng/mL (ref <0.03)
Troponin I: 0.04 ng/mL (ref <0.03)

## 2016-07-04 SURGERY — INSERTION, CARDIAC PACEMAKER
Anesthesia: General | Laterality: Left | Wound class: Clean

## 2016-07-04 MED ORDER — MIDAZOLAM HCL 2 MG/2ML IJ SOLN
INTRAMUSCULAR | Status: DC | PRN
  Administered 2016-07-04 (×2): 1 mg via INTRAVENOUS

## 2016-07-04 MED ORDER — GENTAMICIN SULFATE 40 MG/ML IJ SOLN
INTRAMUSCULAR | Status: AC
  Filled 2016-07-04: qty 2

## 2016-07-04 MED ORDER — LIDOCAINE HCL (PF) 2 % IJ SOLN
INTRAMUSCULAR | Status: DC | PRN
  Administered 2016-07-04: 80 mg via INTRADERMAL

## 2016-07-04 MED ORDER — PROPOFOL 10 MG/ML IV BOLUS
INTRAVENOUS | Status: AC
  Filled 2016-07-04: qty 20

## 2016-07-04 MED ORDER — SODIUM CHLORIDE 0.9 % IJ SOLN
INTRAMUSCULAR | Status: AC
  Filled 2016-07-04: qty 10

## 2016-07-04 MED ORDER — LIDOCAINE 1 % OPTIME INJ - NO CHARGE
INTRAMUSCULAR | Status: DC | PRN
  Administered 2016-07-04: 30 mL

## 2016-07-04 MED ORDER — ONDANSETRON HCL 4 MG/2ML IJ SOLN: INTRAMUSCULAR | Status: DC | PRN

## 2016-07-04 MED ORDER — PROPOFOL 500 MG/50ML IV EMUL
INTRAVENOUS | Status: AC
  Filled 2016-07-04: qty 50

## 2016-07-04 MED ORDER — PROPOFOL 500 MG/50ML IV EMUL
INTRAVENOUS | Status: DC | PRN
  Administered 2016-07-04: 150 ug/kg/min via INTRAVENOUS

## 2016-07-04 MED ORDER — SODIUM CHLORIDE 0.9 % IR SOLN
80.0000 mg | Status: DC
  Filled 2016-07-04: qty 2

## 2016-07-04 MED ORDER — CEFAZOLIN SODIUM 1 G IJ SOLR
INTRAMUSCULAR | Status: DC | PRN
  Administered 2016-07-04: 2 g via INTRAMUSCULAR

## 2016-07-04 MED ORDER — FENTANYL CITRATE (PF) 100 MCG/2ML IJ SOLN
INTRAMUSCULAR | Status: AC
  Filled 2016-07-04: qty 2

## 2016-07-04 MED ORDER — ONDANSETRON HCL 4 MG/2ML IJ SOLN
INTRAMUSCULAR | Status: AC
  Filled 2016-07-04: qty 2

## 2016-07-04 MED ORDER — IOPAMIDOL (ISOVUE-M 200) INJECTION 41%
INTRAMUSCULAR | Status: AC
  Filled 2016-07-04: qty 10

## 2016-07-04 MED ORDER — GENTAMICIN SULFATE 40 MG/ML IJ SOLN
INTRAMUSCULAR | Status: DC | PRN
  Administered 2016-07-04: 200 mL

## 2016-07-04 MED ORDER — POTASSIUM CHLORIDE CRYS ER 20 MEQ PO TBCR: 40.0000 meq | EXTENDED_RELEASE_TABLET | Freq: Once | ORAL | Status: DC

## 2016-07-04 MED ORDER — CEFAZOLIN SODIUM 1 G IJ SOLR
INTRAMUSCULAR | Status: AC
  Filled 2016-07-04: qty 20

## 2016-07-04 MED ORDER — CEFAZOLIN IN D5W 1 GM/50ML IV SOLN: 1.0000 g | Freq: Four times a day (QID) | INTRAVENOUS | Status: DC

## 2016-07-04 MED ORDER — LIDOCAINE HCL (PF) 2 % IJ SOLN
INTRAMUSCULAR | Status: AC
  Filled 2016-07-04: qty 2

## 2016-07-04 MED ORDER — PHENYLEPHRINE HCL 10 MG/ML IJ SOLN
INTRAMUSCULAR | Status: DC | PRN
  Administered 2016-07-04 (×2): 80 ug via INTRAVENOUS

## 2016-07-04 MED ORDER — LOSARTAN POTASSIUM 25 MG PO TABS
100.0000 mg | ORAL_TABLET | Freq: Once | ORAL | Status: AC
  Administered 2016-07-04: 100 mg via ORAL
  Filled 2016-07-04: qty 4

## 2016-07-04 MED ORDER — HYDROCHLOROTHIAZIDE 25 MG PO TABS
25.0000 mg | ORAL_TABLET | Freq: Once | ORAL | Status: AC
  Administered 2016-07-04: 25 mg via ORAL
  Filled 2016-07-04: qty 1

## 2016-07-04 MED ORDER — SODIUM CHLORIDE 0.9 % IV SOLN
INTRAVENOUS | Status: DC
  Administered 2016-07-04: 17:00:00 via INTRAVENOUS

## 2016-07-04 MED ORDER — CEFAZOLIN SODIUM-DEXTROSE 2-4 GM/100ML-% IV SOLN: 2.0000 g | INTRAVENOUS | Status: DC

## 2016-07-04 MED ORDER — PHENYLEPHRINE 40 MCG/ML (10ML) SYRINGE FOR IV PUSH (FOR BLOOD PRESSURE SUPPORT)
PREFILLED_SYRINGE | INTRAVENOUS | Status: AC
  Filled 2016-07-04: qty 10

## 2016-07-04 MED ORDER — ONDANSETRON HCL 4 MG/2ML IJ SOLN
INTRAMUSCULAR | Status: DC | PRN
Start: 2016-07-04 — End: 2016-07-04
  Administered 2016-07-04: 4 mg via INTRAVENOUS

## 2016-07-04 MED ORDER — CEFAZOLIN SODIUM-DEXTROSE 2-3 GM-% IV SOLR
2.0000 g | INTRAVENOUS | Status: DC
  Filled 2016-07-04: qty 50

## 2016-07-04 MED ORDER — ONDANSETRON HCL 4 MG/2ML IJ SOLN: 4.0000 mg | Freq: Four times a day (QID) | INTRAMUSCULAR | Status: DC | PRN

## 2016-07-04 MED ORDER — POTASSIUM CHLORIDE 20 MEQ PO PACK
40.0000 meq | PACK | Freq: Two times a day (BID) | ORAL | Status: AC
  Administered 2016-07-04: 40 meq via ORAL
  Filled 2016-07-04: qty 2

## 2016-07-04 MED ORDER — LACTATED RINGERS IV SOLN
INTRAVENOUS | Status: DC | PRN
  Administered 2016-07-04: 13:00:00 via INTRAVENOUS

## 2016-07-04 MED ORDER — PROPOFOL 10 MG/ML IV BOLUS
INTRAVENOUS | Status: DC | PRN
  Administered 2016-07-04: 20 mg via INTRAVENOUS

## 2016-07-04 MED ORDER — ACETAMINOPHEN 325 MG PO TABS
325.0000 mg | ORAL_TABLET | ORAL | Status: DC | PRN
  Administered 2016-07-04: 325 mg via ORAL
  Administered 2016-07-05: 650 mg via ORAL
  Filled 2016-07-04: qty 2
  Filled 2016-07-04: qty 1

## 2016-07-04 MED ORDER — CEFAZOLIN IN D5W 1 GM/50ML IV SOLN
1.0000 g | Freq: Four times a day (QID) | INTRAVENOUS | Status: AC
  Administered 2016-07-04 – 2016-07-05 (×3): 1 g via INTRAVENOUS
  Filled 2016-07-04 (×3): qty 50

## 2016-07-04 MED ORDER — MIDAZOLAM HCL 2 MG/2ML IJ SOLN
INTRAMUSCULAR | Status: AC
  Filled 2016-07-04: qty 2

## 2016-07-04 SURGICAL SUPPLY — 36 items
BAG DECANTER FOR FLEXI CONT (MISCELLANEOUS) ×3 IMPLANT
BRUSH SCRUB 4% CHG (MISCELLANEOUS) ×3 IMPLANT
CABLE SURG 12 DISP A/V CHANNEL (MISCELLANEOUS) ×3 IMPLANT
CANISTER SUCT 1200ML W/VALVE (MISCELLANEOUS) ×3 IMPLANT
CHLORAPREP W/TINT 26ML (MISCELLANEOUS) ×3 IMPLANT
COVER LIGHT HANDLE STERIS (MISCELLANEOUS) ×6 IMPLANT
COVER MAYO STAND STRL (DRAPES) ×3 IMPLANT
DEVICE DISSECT PLASMABLAD 3.0S (MISCELLANEOUS) IMPLANT
DRAPE C-ARM XRAY 36X54 (DRAPES) ×3 IMPLANT
DRESSING TELFA 4X3 1S ST N-ADH (GAUZE/BANDAGES/DRESSINGS) ×3 IMPLANT
DRSG TEGADERM 4X4.75 (GAUZE/BANDAGES/DRESSINGS) ×3 IMPLANT
ELECT REM PT RETURN 9FT ADLT (ELECTROSURGICAL) ×3
ELECTRODE REM PT RTRN 9FT ADLT (ELECTROSURGICAL) ×1 IMPLANT
GLOVE BIO SURGEON STRL SZ7.5 (GLOVE) ×15 IMPLANT
GLOVE BIO SURGEON STRL SZ8 (GLOVE) ×3 IMPLANT
GOWN STRL REUS W/ TWL LRG LVL3 (GOWN DISPOSABLE) ×2 IMPLANT
GOWN STRL REUS W/ TWL XL LVL3 (GOWN DISPOSABLE) ×1 IMPLANT
GOWN STRL REUS W/TWL LRG LVL3 (GOWN DISPOSABLE) ×4
GOWN STRL REUS W/TWL XL LVL3 (GOWN DISPOSABLE) ×2
IMMOBILIZER SHDR LG LX 900803 (SOFTGOODS) ×3 IMPLANT
INTRO PACEMAKR LEAD 9FR 13CM (INTRODUCER) ×3
INTRO PACEMKR SHEATH II 7FR (MISCELLANEOUS) ×3
INTRODUCER PACEMKR LD 9FR 13CM (INTRODUCER) ×1 IMPLANT
INTRODUCER PACEMKR SHTH II 7FR (MISCELLANEOUS) ×1 IMPLANT
IV NS 500ML (IV SOLUTION) ×2
IV NS 500ML BAXH (IV SOLUTION) ×1 IMPLANT
KIT RM TURNOVER STRD PROC AR (KITS) ×3 IMPLANT
LABEL OR SOLS (LABEL) ×3 IMPLANT
LEAD CAPSURE NOVUS 5076-52CM (Lead) ×3 IMPLANT
MARKER SKIN DUAL TIP RULER LAB (MISCELLANEOUS) ×3 IMPLANT
PACEMAKER LEAD ATRL (Lead) ×3 IMPLANT
PACK PACE INSERTION (MISCELLANEOUS) ×3 IMPLANT
PAD ONESTEP ZOLL R SERIES ADT (MISCELLANEOUS) ×3 IMPLANT
PLASMABLADE 3.0S (MISCELLANEOUS)
PPM ADVISA MRI DR A2DR01 (Pacemaker) ×3 IMPLANT
SUT SILK 0 SH 30 (SUTURE) ×6 IMPLANT

## 2016-07-04 NOTE — Anesthesia Postprocedure Evaluation (Signed)
Anesthesia Post Note  Patient: Cody Gonzales  Procedure(s) Performed: Procedure(s) (LRB): insertion of permament pacemaker dual chamber (Left)  Patient location during evaluation: PACU Anesthesia Type: General Level of consciousness: awake and alert and oriented Pain management: pain level controlled Vital Signs Assessment: post-procedure vital signs reviewed and stable Respiratory status: spontaneous breathing, nonlabored ventilation and respiratory function stable Cardiovascular status: blood pressure returned to baseline and stable Postop Assessment: no signs of nausea or vomiting Anesthetic complications: no     Last Vitals:  Vitals:   07/04/16 1437 07/04/16 1452  BP: 129/88 133/85  Pulse: 61 (!) 59  Resp: 17 20  Temp:      Last Pain:  Vitals:   07/04/16 1452  TempSrc:   PainSc: 0-No pain                 Benecio Kluger

## 2016-07-04 NOTE — Transfer of Care (Signed)
Immediate Anesthesia Transfer of Care Note  Patient: Cody Gonzales  Procedure(s) Performed: Procedure(s): insertion of permament pacemaker dual chamber (Left)  Patient Location: PACU  Anesthesia Type:General  Level of Consciousness: sedated  Airway & Oxygen Therapy: Patient Spontanous Breathing and Patient connected to nasal cannula oxygen  Post-op Assessment: Report given to RN and Post -op Vital signs reviewed and stable  Post vital signs: Reviewed and stable  Last Vitals:  Vitals:   07/04/16 1200 07/04/16 1407  BP: 130/70 (!) 141/90  Pulse: (!) 49 (!) 59  Resp: (!) 24 16  Temp:  36.3 C    Last Pain:  Vitals:   07/04/16 0800  TempSrc: Oral         Complications: No apparent anesthesia complications

## 2016-07-04 NOTE — Progress Notes (Signed)
Dr. Clayborn Bigness informed of pt.s inconsistent pacer and current VS. Dr. Clayborn Bigness stated he will come and look at pacer bedside this shift. Discussed pt.'s HTN, MD ordered PRN if needed and instructed RN to give daily BP med now.  Will continue to monitor pt. Closely.

## 2016-07-04 NOTE — Progress Notes (Signed)
[place] [facility] [place] NAME: Cody Gonzales    MR#:  [id number]  DATE OF BIRTH:  [birth year]  SUBJECTIVE:  CHIEF COMPLAINT:   Chief Complaint  Patient presents with  . Bradycardia   Feels better. No further syncopal episodes. Heart rate 50.  REVIEW OF SYSTEMS:    Review of Systems  Constitutional: Negative for chills and fever.  HENT: Negative for sore throat.   Eyes: Negative for blurred vision, double vision and pain.  Respiratory: Negative for cough, hemoptysis, shortness of breath and wheezing.   Cardiovascular: Negative for chest pain, palpitations, orthopnea and leg swelling.  Gastrointestinal: Negative for abdominal pain, constipation, diarrhea, heartburn, nausea and vomiting.  Genitourinary: Negative for dysuria and hematuria.  Musculoskeletal: Negative for back pain and joint pain.  Skin: Negative for rash.  Neurological: Negative for sensory change, speech change, focal weakness and headaches.  Endo/Heme/Allergies: Does not bruise/bleed easily.  Psychiatric/Behavioral: Negative for depression. The patient is not nervous/anxious.     DRUG ALLERGIES:  No Known Allergies  VITALS:  Blood pressure 130/70, pulse (!) 49, temperature 97.9 F (36.6 C), temperature source Oral, resp. rate (!) 24, height 5\' 6"  (1.676 m), weight 77.6 kg (171 lb 1.2 oz), SpO2 95 %.  PHYSICAL EXAMINATION:   Physical Exam  GENERAL:  66 y.o.-year-old patient lying in the bed with no acute distress.  EYES: Pupils equal, round, reactive to light and accommodation. No scleral icterus. Extraocular muscles intact.  HEENT: Head atraumatic, normocephalic. Oropharynx and nasopharynx clear.  NECK:  Supple, no jugular venous distention. No thyroid enlargement, no tenderness.  LUNGS: Normal breath sounds bilaterally, no wheezing, rales, rhonchi. No use of accessory muscles of respiration.  CARDIOVASCULAR: S1, S2 normal. No murmurs, rubs, or gallops.  ABDOMEN: Soft,  nontender, nondistended. Bowel sounds present. No organomegaly or mass.  EXTREMITIES: No cyanosis, clubbing or edema b/l.    NEUROLOGIC: Cranial nerves II through XII are intact. No focal Motor or sensory deficits b/l.   PSYCHIATRIC: The patient is alert and oriented x 3.  SKIN: No obvious rash, lesion, or ulcer.   LABORATORY PANEL:   CBC  Recent Labs Lab 07/04/16 0610  WBC 11.2*  HGB 15.7  HCT 44.3  PLT 224   ------------------------------------------------------------------------------------------------------------------ Chemistries   Recent Labs Lab 07/03/16 2139 07/04/16 0610  NA 140 140  K 3.3* 3.5  CL 109 106  CO2 25 26  GLUCOSE 102* 114*  BUN 17 18  CREATININE 0.69 0.77  CALCIUM 8.4* 9.4  AST 26  --   ALT 31  --   ALKPHOS 67  --   BILITOT 1.2  --    ------------------------------------------------------------------------------------------------------------------  Cardiac Enzymes  Recent Labs Lab 07/04/16 0937  TROPONINI 0.04*   ------------------------------------------------------------------------------------------------------------------  RADIOLOGY:  Dg Chest Portable 1 View  Result Date: 07/03/2016 CLINICAL DATA:  Pacer placement EXAM: PORTABLE CHEST 1 VIEW COMPARISON:  None. FINDINGS: External pacing wires over the heart. No pneumothorax. No pulmonary edema. IMPRESSION: No pneumothorax or pulmonary edema. Electronically Signed   By: Suzy Bouchard M.D.   On: 07/03/2016 21:39     ASSESSMENT AND PLAN:   #1. Complete heart block, status post temporary cardiac pacemaker placement by Dr. Clayborn Bigness Now in ICU. Scheduled for permanent pacemaker later today.  #2. Chest pressure Troponin normal  #3. Near-syncope due to complete heart block  #4. Essential hypertension, continue outpatient medications  All the records are reviewed and case discussed with Care Management/Social Workerr. Management plans discussed with the  patient, family and they  are in agreement.  CODE STATUS: FULL CODE  DVT Prophylaxis: SCDs  TOTAL TIME TAKING CARE OF THIS PATIENT: 35 minutes.   POSSIBLE D/C IN 1-2 DAYS, DEPENDING ON CLINICAL CONDITION.  Hillary Bow R M.D on 07/04/2016 at 1:04 PM  Between 7am to 6pm - Pager - [phone]  After 6pm go to [email or web address] - password [email or web address] [facility]  Office  [phone]  CC: Primary care physician; [facility]  Note: This dictation was prepared with Dragon dictation along with smaller phrase technology. Any transcriptional errors that result from this process are unintentional.

## 2016-07-04 NOTE — Interval H&P Note (Signed)
History and Physical Interval Note:  07/04/2016 12:08 PM  Cody Gonzales  has presented today for surgery, with the diagnosis of Complete Heart Block/Bradycardia  The various methods of treatment have been discussed with the patient and family. After consideration of risks, benefits and other options for treatment, the patient has consented to  Procedure(s): insertion of permament pacemaker dual chamber (Left) as a surgical intervention .  The patient's history has been reviewed, patient examined, no change in status, stable for surgery.  I have reviewed the patient's chart and labs.  Questions were answered to the patient's satisfaction.     Litzy Dicker [employer]

## 2016-07-04 NOTE — Anesthesia Preprocedure Evaluation (Signed)
Anesthesia Evaluation  Patient identified by MRN, date of birth, ID band Patient awake    Reviewed: Allergy & Precautions, NPO status , Patient's Chart, lab work & pertinent test results  History of Anesthesia Complications Negative for: history of anesthetic complications  Airway Mallampati: III  TM Distance: >3 FB Neck ROM: Full    Dental no notable dental hx.    Pulmonary neg pulmonary ROS, neg sleep apnea, neg COPD,    breath sounds clear to auscultation- rhonchi (-) wheezing      Cardiovascular hypertension, Pt. on medications (-) CAD and (-) Past MI + dysrhythmias (complete heart block, currently has transvenous pacing)  Rhythm:Regular Rate:Normal - Systolic murmurs and - Diastolic murmurs Echo [id number]: NORMAL LEFT VENTRICULAR SYSTOLIC FUNCTION WITH MILD LVH NORMAL RIGHT VENTRICULAR SYSTOLIC FUNCTION MILD VALVULAR REGURGITATION (mild MR) NO VALVULAR STENOSIS EF 50-55%   Neuro/Psych negative neurological ROS  negative psych ROS   GI/Hepatic negative GI ROS, Neg liver ROS,   Endo/Other  negative endocrine ROSneg diabetes  Renal/GU negative Renal ROS     Musculoskeletal negative musculoskeletal ROS (+)   Abdominal (+) - obese,   Peds  Hematology negative hematology ROS (+)   Anesthesia Other Findings Past Medical History: No date: Hypertension No date: Syncope   Reproductive/Obstetrics                             Anesthesia Physical Anesthesia Plan  ASA: II  Anesthesia Plan: General   Post-op Pain Management:    Induction: Intravenous  Airway Management Planned: Natural Airway  Additional Equipment:   Intra-op Plan:   Post-operative Plan:   Informed Consent: I have reviewed the patients History and Physical, chart, labs and discussed the procedure including the risks, benefits and alternatives for the proposed anesthesia with the patient or authorized  representative who has indicated his/her understanding and acceptance.   Dental advisory given  Plan Discussed with: CRNA and Anesthesiologist  Anesthesia Plan Comments:         Anesthesia Quick Evaluation

## 2016-07-04 NOTE — H&P (View-Only) (Signed)
Reason for Consult:CHB Bradycardia Syncope Referring Physician: Dr Reita Cliche ER Cardiologist Cody Gonzales is an 66 y.o. male.  HPI: Pt is a 66 y/o WM with multilpe episodes of syncope. He had negative ECHO/Myoiew . Holter which  Was recently wore is pending results. He had to episodes of syncope today so he came to the ER. He was found to have a Hr of 36 CHB. He denied CP SOB or palpitations. Now here for evaluation.  Past Medical History:  Diagnosis Date  . Hypertension   . Syncope     Past Surgical History:  Procedure Laterality Date  . CARDIAC SURGERY    . carpel tunnel      No family history on file.  Social History:  reports that he has never smoked. He has never used smokeless tobacco. He reports that he drinks alcohol. He reports that he does not use drugs.  Allergies: No Known Allergies  Medications: I have reviewed the patient's current medications.  Results for orders placed or performed during the hospital encounter of 07/03/16 (from the past 48 hour(s))  Troponin I     Status: None   Collection Time: 07/03/16  9:39 PM  Result Value Ref Range   Troponin I <0.03 <0.03 ng/mL  Comprehensive metabolic panel     Status: Abnormal   Collection Time: 07/03/16  9:39 PM  Result Value Ref Range   Sodium 140 135 - 145 mmol/L   Potassium 3.3 (L) 3.5 - 5.1 mmol/L   Chloride 109 101 - 111 mmol/L   CO2 25 22 - 32 mmol/L   Glucose, Bld 102 (H) 65 - 99 mg/dL   BUN 17 6 - 20 mg/dL   Creatinine, Ser 0.69 0.61 - 1.24 mg/dL   Calcium 8.4 (L) 8.9 - 10.3 mg/dL   Total Protein 6.7 6.5 - 8.1 g/dL   Albumin 3.8 3.5 - 5.0 g/dL   AST 26 15 - 41 U/L   ALT 31 17 - 63 U/L   Alkaline Phosphatase 67 38 - 126 U/L   Total Bilirubin 1.2 0.3 - 1.2 mg/dL   GFR calc non Af Amer >60 >60 mL/min   GFR calc Af Amer >60 >60 mL/min    Comment: (NOTE) The eGFR has been calculated using the CKD EPI equation. This calculation has not been validated in all clinical situations. eGFR's persistently <60  mL/min signify possible Chronic Kidney Disease.    Anion gap 6 5 - 15  Glucose, capillary     Status: Abnormal   Collection Time: 07/03/16 10:23 PM  Result Value Ref Range   Glucose-Capillary 104 (H) 65 - 99 mg/dL    Dg Chest Portable 1 View  Result Date: 07/03/2016 CLINICAL DATA:  Pacer placement EXAM: PORTABLE CHEST 1 VIEW COMPARISON:  None. FINDINGS: External pacing wires over the heart. No pneumothorax. No pulmonary edema. IMPRESSION: No pneumothorax or pulmonary edema. Electronically Signed   By: Suzy Bouchard M.D.   On: 07/03/2016 21:39    Review of Systems  Constitutional: Positive for malaise/fatigue.  HENT: Negative.   Eyes: Negative.   Respiratory: Negative.   Cardiovascular: Negative.   Gastrointestinal: Negative.   Genitourinary: Negative.   Musculoskeletal: Negative.   Skin: Negative.   Neurological: Positive for loss of consciousness.   Blood pressure (!) 134/95, pulse (!) 48, temperature 98.1 F (36.7 C), resp. rate 13, height _0  (1.676 m), weight 79.4 kg (175 lb), SpO2 96 %. Physical Exam  Nursing note and vitals reviewed. Constitutional: He  is oriented to person, place, and time. He appears well-developed and well-nourished.  HENT:  Head: Normocephalic and atraumatic.  Eyes: Conjunctivae and EOM are normal. Pupils are equal, round, and reactive to light.  Neck: Normal range of motion. Neck supple.  Cardiovascular: Normal pulses.  An irregularly irregular rhythm present. Bradycardia present.   Respiratory: Effort normal and breath sounds normal.  GI: Soft. Bowel sounds are normal.  Musculoskeletal: Normal range of motion.  Neurological: He is alert and oriented to person, place, and time. He has normal reflexes.  Skin: Skin is warm and dry.  Psychiatric: He has a normal mood and affect.    Assessment/Plan: CHB Bradycardia Syncope HTN Jehovah Witness/ No Blood products . PLAN Admit to ICU Place Temp Pacer in ER [facility] Refer for PPM with AP  for 07/04/16 L sub Clavian Continue Bp control Continue ASA therapy      Cody Gonzales D Cody Gonzales 07/03/2016, 10:32 PM

## 2016-07-04 NOTE — Progress Notes (Signed)
Called Dr. Clayborn Bigness to report that pt.'s TV pacer was not firing for last 15 minutes, pt.'s HR sustaining btwn 40-50 with lows of 33.  Pt. Asymptomatic, HYPERtensive, other VSS. RN instructed to report if pt.'s c/o dizziness/has a drop in BP, no intervention at this time.  Discussed pt.'s ongoing HTN after daily med given, MD instructed to give PRN. Will continue to monitor pt. Closely.

## 2016-07-04 NOTE — Anesthesia Post-op Follow-up Note (Cosign Needed)
Anesthesia QCDR form completed.        

## 2016-07-04 NOTE — Progress Notes (Addendum)
2240: Paged Dr. Ara Kussmaul.  2300: Discussed that pt.'s TV pacer is inconsistently firing.  Pt. Is asymptomatic, BP is stable, HR is sustaining btwn. 45-50 with lows of 39. Instructed to page Dr. Clayborn Bigness and discuss.

## 2016-07-04 NOTE — Op Note (Signed)
[facility] Card[facility]   07/03/2016 - 07/04/2016                     1:59 PM  PATIENT:  Cody Gonzales    PRE-OPERATIVE DIAGNOSIS:  Complete Heart Block/Bradycardia  POST-OPERATIVE DIAGNOSIS:  Same  PROCEDURE:  insertion of permament pacemaker dual chamber  SURGEON:  Isaias Cowman, MD    ANESTHESIA:     PREOPERATIVE INDICATIONS:  Cody Gonzales is a  66 y.o. male with a diagnosis of Complete Heart Block/Bradycardia who failed conservative measures and elected for surgical management.    The risks benefits and alternatives were discussed with the patient preoperatively including but not limited to the risks of infection, bleeding, cardiopulmonary complications, the need for revision surgery, among others, and the patient was willing to proceed.   OPERATIVE PROCEDURE: The patient was brought to the operating room the fasting state. The left pectoral region was prepped and draped in the usual sterile manner. Anesthesia was obtained 1% lidocaine locally. A 6 cm incision was performed a left pectoral region. The pacemaker pocket was generated by electrocautery and blunt dissection. Access was obtained to left subclavian vein by fine needle aspiration. MRI compatible leads were positioned into the right ventricular apical septum and right atrial appendage under fluoroscopic guidance (5076-52 centimeters, and 5076-45 centimeters). After proper thresholds were obtained. The leads were sutured in place. The pacemaker pocket was irrigated with gentamicin solution. The leads were connected to an MRI compatible dual-chamber rate responsive pacemaker generator (Medtronic A2 DRO 1). The pacemaker generator was positioned into the pocket and the pocket was closed with 2-0 and 4-0 Vicryl, respectively. Steri-Strips arnd pressure dressing were applied. There were no periprocedural complications.

## 2016-07-04 NOTE — Anesthesia Procedure Notes (Signed)
Date/Time: 07/04/2016 12:58 PM Performed by: Hedda Slade Pre-anesthesia Checklist: Patient identified, Emergency Drugs available, Suction available and Patient being monitored Patient Re-evaluated:Patient Re-evaluated prior to inductionOxygen Delivery Method: Nasal cannula

## 2016-07-04 NOTE — Progress Notes (Signed)
Dr. Clayborn Bigness bedside monitoring TV pacer. No new orders at this time.

## 2016-07-05 DIAGNOSIS — I10 Essential (primary) hypertension: Secondary | ICD-10-CM | POA: Diagnosis not present

## 2016-07-05 DIAGNOSIS — R0789 Other chest pain: Secondary | ICD-10-CM | POA: Diagnosis not present

## 2016-07-05 DIAGNOSIS — I442 Atrioventricular block, complete: Secondary | ICD-10-CM | POA: Diagnosis not present

## 2016-07-05 DIAGNOSIS — R55 Syncope and collapse: Secondary | ICD-10-CM | POA: Diagnosis not present

## 2016-07-05 MED ORDER — CEPHALEXIN 250 MG PO CAPS: 250.0000 mg | ORAL_CAPSULE | Freq: Four times a day (QID) | ORAL | 0 refills | Status: DC

## 2016-07-05 NOTE — Progress Notes (Signed)
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 Patient states he feels well and is ambulating this morning.    Vitals:   07/04/16 2100 07/04/16 2300 07/05/16 0122 07/05/16 0300  BP: 125/90  (!) 131/95   Pulse: 60 60 62 60  Resp: 18 18 15 16   Temp: 98.3 F (36.8 C)     TempSrc: Oral     SpO2: 96% 92% 97% 94%  Weight:      Height:         Intake/Output Summary (Last 24 hours) at 07/05/16 0844 Last data filed at 07/04/16 2200  Gross per 24 hour  Intake            733.5 ml  Output              903 ml  Net           -169.5 ml      PHYSICAL EXAM  General: Well developed, well nourished, in no acute distress HEENT:  Normocephalic and atramatic Neck:  No JVD.  Lungs: Clear bilaterally to auscultation and percussion. Heart: HRRR . Normal S1 and S2 without gallops or murmurs.  Incision site: No drainage, active bleeding, warmth, or erythema. Minimal dried blood present. Steri strips intact. Abdomen: Bowel sounds are positive, abdomen soft and non-tender  Msk:  Back normal, normal gait. Normal strength and tone for age. Extremities: No clubbing, cyanosis or edema.   Neuro: Alert and oriented X 3. Psych:  Good affect, responds appropriately   LABS: Basic Metabolic Panel:  Recent Labs  07/03/16 2139 07/04/16 0610  NA 140 140  K 3.3* 3.5  CL 109 106  CO2 25 26  GLUCOSE 102* 114*  BUN 17 18  CREATININE 0.69 0.77  CALCIUM 8.4* 9.4   Liver Function Tests:  Recent Labs  07/03/16 2139  AST 26  ALT 31  ALKPHOS 67  BILITOT 1.2  PROT 6.7  ALBUMIN 3.8   No results for input(s): LIPASE, AMYLASE in the last 72 hours. CBC:  Recent Labs  07/04/16 0610  WBC 11.2*  HGB 15.7  HCT 44.3  MCV 97.5  PLT 224   Cardiac Enzymes:  Recent Labs  07/04/16 0115 07/04/16 0610 07/04/16 0937  TROPONINI 0.03* 0.04* 0.04*   BNP: Invalid input(s): POCBNP D-Dimer: No results for input(s):  DDIMER in the last 72 hours. Hemoglobin A1C: No results for input(s): HGBA1C in the last 72 hours. Fasting Lipid Panel: No results for input(s): CHOL, HDL, LDLCALC, TRIG, CHOLHDL, LDLDIRECT in the last 72 hours. Thyroid Function Tests: No results for input(s): TSH, T4TOTAL, T3FREE, THYROIDAB in the last 72 hours.  Invalid input(s): FREET3 Anemia Panel: No results for input(s): VITAMINB12, FOLATE, FERRITIN, TIBC, IRON, RETICCTPCT in the last 72 hours.  Dg Chest Port 1 View  Result Date: 07/04/2016 CLINICAL DATA:  Status post pacemaker insertion. EXAM: PORTABLE CHEST 1 VIEW COMPARISON:  None. FINDINGS: The heart size and mediastinal contours are within normal limits. Both lungs are clear. Left-sided pacemaker is noted with leads in grossly good position. No pneumothorax or pleural effusion is noted. The visualized skeletal structures are unremarkable. IMPRESSION: Status post placement of left-sided pacemaker. No pneumothorax is noted. Electronically Signed   By: Marijo Conception, M.D.   On: 07/04/2016 14:47   Dg Chest Portable 1 View  Result Date: 07/03/2016 CLINICAL DATA:  Pacer placement EXAM: PORTABLE CHEST 1 VIEW COMPARISON:  None. FINDINGS: External pacing wires over the heart.  No pneumothorax. No pulmonary edema. IMPRESSION: No pneumothorax or pulmonary edema. Electronically Signed   By: Suzy Bouchard M.D.   On: 07/03/2016 21:39   Dg C-arm 1-60 Min-no Report  Result Date: 07/04/2016 There is no Radiologist interpretation  for this exam.   Chest xray: Negative for pneumothorax TELEMETRY: AV dual-paced rhythm at a rate of 61 bpm  ASSESSMENT AND PLAN:  Principal Problem:   Complete heart block ([place]) Active Problems:   Near syncope   Essential hypertension   History of temporary cardiac pacemaker treatment    1. Complete heart block/bradycardia, status post dual chamber pacemaker implantation without peri- or post-operative complications. Patient ambulating this morning, rate  60 bpm at rest.    Recommedations: 1. Follow-up with Dr. Nehemiah Massed at [facility] in 1 week. 2. Keflex 250 mg QID x 7 days 3. Patient may shower tomorrow, avoiding direct contact of shower head to incision site. Leave steri-strips alone. Avoid lifting left arm above head or lifting greater than 15 pounds for 6 weeks. Patient may drive locally in 2-3 days.    Clabe Seal, PA-C 07/05/2016 8:44 AM

## 2016-07-05 NOTE — Progress Notes (Signed)
Received orders for patient d/c to home. Pt A&O x 4, skin warm & dry, denies SOB, CP, or any other complaint; talkative, laughing, ready for d/c. Telemetry removed from pt, IV's were d/c'd. Surgical incision site - steri-strips in place, no drainage or redness noted to site. D/C instructions were reviewed w/ pt, including a pacemaker insertion post-op care handout. Pt verbalized his understanding of written and verbal instructions, stating he would call Dr. Saralyn Pilar' office to make his follow-up appointment and he will get his antibiotic which was sent electronically to [facility]. Pt taken to visitor's entrance w/ his wife, by Kennyth Lose, [place] via wheelchair. Pt signed that he received his d/c instructions.

## 2016-07-05 NOTE — Discharge Instructions (Addendum)
May shower tomorrow, avoiding direct contact of shower head to incision site. Leave steri-strips alone. Avoid lifting left arm above head or lifting greater than 15 pounds for 6 weeks. Patient may drive locally in 2-3 days.

## 2016-07-07 NOTE — Discharge Summary (Signed)
[place] [facility] [place] NAME: Cody Gonzales    MR#:  [id number]  DATE OF BIRTH:  10-Sep-1950  DATE OF ADMISSION:  07/03/2016 ADMITTING PHYSICIAN: Theodoro Grist, MD  DATE OF DISCHARGE: 07/05/2016 12:56 PM  PRIMARY CARE PHYSICIAN: [facility]   ADMISSION DIAGNOSIS:  Complete heart block ([place]) [I44.2] Pacemaker [Z95.0]  DISCHARGE DIAGNOSIS:  Principal Problem:   Complete heart block ([place]) Active Problems:   Near syncope   Essential hypertension   History of temporary cardiac pacemaker treatment   SECONDARY DIAGNOSIS:   Past Medical History:  Diagnosis Date  . Hypertension   . Syncope      ADMITTING HISTORY  HISTORY OF PRESENT ILLNESS: Cody Gonzales  is a 66 y.o. male with a known history of Essential hypertension, syncopal episode since October 2017, who presents to the hospital with complaints of 2 presyncopal episodes today. Apparently patient has been evaluated by cardiologist for a few syncopal episodes, he underwent a stress test, had a Holter monitor, echocardiogram, all of them were unremarkable. Today, however, he had a few episodes of presyncopal, when he would get lightheaded and almost passed out. He presented to emergency room where he was noted to have complete heart block. Cardiology consultation was requested emergently and temporary pacemaker was placed. The patient feels good now. He admits of having had some chest tightness with episodes of presyncope earlier today. Hospitalist services were contacted for admission  HOSPITAL COURSE:   #1. Complete heart block,  temporary cardiac pacemaker placement by Dr. Clayborn Bigness initially. Patient was admitted to ICU. Permanent pacemaker placed by Dr. Stephani Police.  #2. Chest pressure Troponin normal  #3. Near-syncope due to complete heart block  #4. Essential hypertension, continue outpatient medications  Monitored overnight after PPM. Stable by day of  discharge.  Discharged home  CONSULTS OBTAINED:    DRUG ALLERGIES:  No Known Allergies  DISCHARGE MEDICATIONS:   Discharge Medication List as of 07/05/2016 11:07 AM    START taking these medications   Details  cephALEXin (KEFLEX) 250 MG capsule Take 1 capsule (250 mg total) by mouth 4 (four) times daily., Starting Tue 07/05/2016, Normal      CONTINUE these medications which have NOT CHANGED   Details  aspirin EC 81 MG tablet Take 81 mg by mouth daily., Historical Med    losartan-hydrochlorothiazide (HYZAAR) 100-25 MG tablet Take 1 tablet by mouth every morning., Starting Thu 04/07/2016, Historical Med        Today   VITAL SIGNS:  Blood pressure 127/80, pulse 65, temperature 98.3 F (36.8 C), temperature source Oral, resp. rate 17, height 5\' 6"  (1.676 m), weight 77.6 kg (171 lb 1.2 oz), SpO2 95 %.  I/O:  No intake or output data in the 24 hours ending 07/07/16 1531  PHYSICAL EXAMINATION:  Physical Exam  GENERAL:  66 y.o.-year-old patient lying in the bed with no acute distress.  LUNGS: Normal breath sounds bilaterally, no wheezing, rales,rhonchi or crepitation. No use of accessory muscles of respiration.  CARDIOVASCULAR: S1, S2 normal. No murmurs, rubs, or gallops.  ABDOMEN: Soft, non-tender, non-distended. Bowel sounds present. No organomegaly or mass.  NEUROLOGIC: Moves all 4 extremities. PSYCHIATRIC: The patient is alert and oriented x 3.  SKIN: No obvious rash, lesion, or ulcer.   DATA REVIEW:   CBC  Recent Labs Lab 07/04/16 0610  WBC 11.2*  HGB 15.7  HCT 44.3  PLT 224    Chemistries   Recent Labs Lab 07/03/16 2139 07/04/16 0610  NA 140 140  K 3.3* 3.5  CL 109 106  CO2 25 26  GLUCOSE 102* 114*  BUN 17 18  CREATININE 0.69 0.77  CALCIUM 8.4* 9.4  AST 26  --   ALT 31  --   ALKPHOS 67  --   BILITOT 1.2  --     Cardiac Enzymes  Recent Labs Lab 07/04/16 0937  TROPONINI 0.04*    Microbiology Results  Results for orders placed or  performed during the hospital encounter of 07/03/16  MRSA PCR Screening     Status: None   Collection Time: 07/03/16 10:47 PM  Result Value Ref Range Status   MRSA by PCR NEGATIVE NEGATIVE Final    Comment:        The GeneXpert MRSA Assay (FDA approved for NASAL specimens only), is one component of a comprehensive MRSA colonization surveillance program. It is not intended to diagnose MRSA infection nor to guide or monitor treatment for MRSA infections.     RADIOLOGY:  No results found.  Follow up with PCP in 1 week.  Management plans discussed with the patient, family and they are in agreement.  CODE STATUS:  Code Status History    Date Active Date Inactive Code Status Order ID Comments User Context   07/03/2016 10:42 PM 07/05/2016  4:01 PM Full Code [id number]  Theodoro Grist, MD Inpatient    Advance Directive Documentation   Flowsheet Row Most Recent Value  Type of Advance Directive  Living will, Healthcare Power of Attorney  Pre-existing out of facility DNR order (yellow form or pink MOST form)  No data  "MOST" Form in Place?  No data      TOTAL TIME TAKING CARE OF THIS PATIENT ON DAY OF DISCHARGE: more than 30 minutes.   Hillary Bow R M.D on 07/07/2016 at 3:31 PM  Between 7am to 6pm - Pager - [phone]  After 6pm go to [email or web address] - password [email or web address] [place] [place] [facility]  Office  [phone]  CC: Primary care physician; [facility]  Note: This dictation was prepared with Dragon dictation along with smaller phrase technology. Any transcriptional errors that result from this process are unintentional.

## 2016-07-13 DIAGNOSIS — Z7689 Persons encountering health services in other specified circumstances: Secondary | ICD-10-CM | POA: Diagnosis not present

## 2016-07-13 DIAGNOSIS — R55 Syncope and collapse: Secondary | ICD-10-CM | POA: Diagnosis not present

## 2016-07-13 DIAGNOSIS — I10 Essential (primary) hypertension: Secondary | ICD-10-CM | POA: Diagnosis not present

## 2016-07-13 DIAGNOSIS — I442 Atrioventricular block, complete: Secondary | ICD-10-CM | POA: Diagnosis not present

## 2016-08-09 DIAGNOSIS — I442 Atrioventricular block, complete: Secondary | ICD-10-CM | POA: Diagnosis not present

## 2016-08-11 DIAGNOSIS — I10 Essential (primary) hypertension: Secondary | ICD-10-CM | POA: Diagnosis not present

## 2016-09-08 DIAGNOSIS — I10 Essential (primary) hypertension: Secondary | ICD-10-CM | POA: Diagnosis not present

## 2016-10-28 DIAGNOSIS — E782 Mixed hyperlipidemia: Secondary | ICD-10-CM | POA: Diagnosis not present

## 2016-10-28 DIAGNOSIS — Z79899 Other long term (current) drug therapy: Secondary | ICD-10-CM | POA: Diagnosis not present

## 2016-10-28 DIAGNOSIS — K635 Polyp of colon: Secondary | ICD-10-CM | POA: Diagnosis not present

## 2016-10-28 DIAGNOSIS — R7309 Other abnormal glucose: Secondary | ICD-10-CM | POA: Diagnosis not present

## 2016-10-28 DIAGNOSIS — I10 Essential (primary) hypertension: Secondary | ICD-10-CM | POA: Diagnosis not present

## 2016-10-28 DIAGNOSIS — Z0000 Encounter for general adult medical examination without abnormal findings: Secondary | ICD-10-CM | POA: Diagnosis not present

## 2016-10-28 DIAGNOSIS — Z125 Encounter for screening for malignant neoplasm of prostate: Secondary | ICD-10-CM | POA: Diagnosis not present

## 2016-10-31 DIAGNOSIS — I10 Essential (primary) hypertension: Secondary | ICD-10-CM | POA: Diagnosis not present

## 2016-10-31 DIAGNOSIS — I442 Atrioventricular block, complete: Secondary | ICD-10-CM | POA: Diagnosis not present

## 2016-11-16 DIAGNOSIS — L820 Inflamed seborrheic keratosis: Secondary | ICD-10-CM | POA: Diagnosis not present

## 2016-11-16 DIAGNOSIS — L821 Other seborrheic keratosis: Secondary | ICD-10-CM | POA: Diagnosis not present

## 2016-11-16 DIAGNOSIS — Z85828 Personal history of other malignant neoplasm of skin: Secondary | ICD-10-CM | POA: Diagnosis not present

## 2016-11-16 DIAGNOSIS — D1801 Hemangioma of skin and subcutaneous tissue: Secondary | ICD-10-CM | POA: Diagnosis not present

## 2016-11-16 DIAGNOSIS — L570 Actinic keratosis: Secondary | ICD-10-CM | POA: Diagnosis not present

## 2016-12-20 DIAGNOSIS — Z79899 Other long term (current) drug therapy: Secondary | ICD-10-CM | POA: Diagnosis not present

## 2016-12-20 DIAGNOSIS — E782 Mixed hyperlipidemia: Secondary | ICD-10-CM | POA: Diagnosis not present

## 2016-12-20 DIAGNOSIS — R7309 Other abnormal glucose: Secondary | ICD-10-CM | POA: Diagnosis not present

## 2016-12-20 DIAGNOSIS — I10 Essential (primary) hypertension: Secondary | ICD-10-CM | POA: Diagnosis not present

## 2016-12-20 DIAGNOSIS — Z125 Encounter for screening for malignant neoplasm of prostate: Secondary | ICD-10-CM | POA: Diagnosis not present

## 2017-01-24 DIAGNOSIS — I442 Atrioventricular block, complete: Secondary | ICD-10-CM | POA: Diagnosis not present

## 2017-03-18 DIAGNOSIS — M12869 Other specific arthropathies, not elsewhere classified, unspecified knee: Secondary | ICD-10-CM | POA: Diagnosis not present

## 2017-03-21 DIAGNOSIS — M25561 Pain in right knee: Secondary | ICD-10-CM | POA: Diagnosis not present

## 2017-03-21 DIAGNOSIS — M65861 Other synovitis and tenosynovitis, right lower leg: Secondary | ICD-10-CM | POA: Diagnosis not present

## 2017-03-28 DIAGNOSIS — M5416 Radiculopathy, lumbar region: Secondary | ICD-10-CM | POA: Diagnosis not present

## 2017-03-28 DIAGNOSIS — M5136 Other intervertebral disc degeneration, lumbar region: Secondary | ICD-10-CM | POA: Diagnosis not present

## 2017-03-28 DIAGNOSIS — M1611 Unilateral primary osteoarthritis, right hip: Secondary | ICD-10-CM | POA: Diagnosis not present

## 2017-03-28 DIAGNOSIS — M47816 Spondylosis without myelopathy or radiculopathy, lumbar region: Secondary | ICD-10-CM | POA: Diagnosis not present

## 2017-03-28 DIAGNOSIS — M545 Low back pain: Secondary | ICD-10-CM | POA: Diagnosis not present

## 2017-03-28 DIAGNOSIS — M25551 Pain in right hip: Secondary | ICD-10-CM | POA: Diagnosis not present

## 2017-03-29 ENCOUNTER — Other Ambulatory Visit: Payer: Self-pay | Admitting: Orthopedic Surgery

## 2017-03-29 DIAGNOSIS — M5416 Radiculopathy, lumbar region: Secondary | ICD-10-CM

## 2017-03-31 ENCOUNTER — Ambulatory Visit
Admission: RE | Admit: 2017-03-31 | Discharge: 2017-03-31 | Disposition: A | Discharge status: Home / Self Care | Payer: PPO | Source: Ambulatory Visit | Attending: Orthopedic Surgery | Admitting: Orthopedic Surgery

## 2017-03-31 DIAGNOSIS — M5416 Radiculopathy, lumbar region: Secondary | ICD-10-CM

## 2017-03-31 DIAGNOSIS — M5127 Other intervertebral disc displacement, lumbosacral region: Secondary | ICD-10-CM | POA: Diagnosis not present

## 2017-03-31 MED ORDER — MEPERIDINE HCL 100 MG/ML IJ SOLN
100.0000 mg | Freq: Once | INTRAMUSCULAR | Status: AC
  Administered 2017-03-31: 100 mg via INTRAMUSCULAR

## 2017-03-31 MED ORDER — IOPAMIDOL (ISOVUE-M 200) INJECTION 41%
15.0000 mL | Freq: Once | INTRAMUSCULAR | Status: AC
  Administered 2017-03-31: 15 mL via INTRATHECAL

## 2017-03-31 MED ORDER — ONDANSETRON HCL 4 MG/2ML IJ SOLN
4.0000 mg | Freq: Once | INTRAMUSCULAR | Status: AC
  Administered 2017-03-31: 4 mg via INTRAMUSCULAR

## 2017-03-31 MED ORDER — DIAZEPAM 5 MG PO TABS
5.0000 mg | ORAL_TABLET | Freq: Once | ORAL | Status: AC
  Administered 2017-03-31: 5 mg via ORAL

## 2017-03-31 NOTE — Discharge Instructions (Signed)

## 2017-04-04 ENCOUNTER — Other Ambulatory Visit: Payer: Self-pay | Admitting: Orthopedic Surgery

## 2017-04-04 DIAGNOSIS — M5416 Radiculopathy, lumbar region: Secondary | ICD-10-CM

## 2017-04-04 DIAGNOSIS — M47816 Spondylosis without myelopathy or radiculopathy, lumbar region: Secondary | ICD-10-CM

## 2017-04-07 ENCOUNTER — Other Ambulatory Visit: Payer: Self-pay

## 2017-04-11 ENCOUNTER — Ambulatory Visit
Admission: RE | Admit: 2017-04-11 | Discharge: 2017-04-11 | Disposition: A | Discharge status: Home / Self Care | Payer: PPO | Source: Ambulatory Visit | Attending: Orthopedic Surgery | Admitting: Orthopedic Surgery

## 2017-04-11 DIAGNOSIS — M5416 Radiculopathy, lumbar region: Secondary | ICD-10-CM | POA: Diagnosis not present

## 2017-04-11 DIAGNOSIS — M47816 Spondylosis without myelopathy or radiculopathy, lumbar region: Secondary | ICD-10-CM

## 2017-04-11 MED ORDER — IOPAMIDOL (ISOVUE-M 200) INJECTION 41%
1.0000 mL | Freq: Once | INTRAMUSCULAR | Status: AC
  Administered 2017-04-11: 1 mL via EPIDURAL

## 2017-04-11 MED ORDER — METHYLPREDNISOLONE ACETATE 40 MG/ML INJ SUSP (RADIOLOG
120.0000 mg | Freq: Once | INTRAMUSCULAR | Status: AC
  Administered 2017-04-11: 120 mg via EPIDURAL

## 2017-04-11 NOTE — Discharge Instructions (Signed)

## 2017-04-19 ENCOUNTER — Other Ambulatory Visit: Payer: Self-pay | Admitting: Orthopedic Surgery

## 2017-04-19 DIAGNOSIS — M5416 Radiculopathy, lumbar region: Secondary | ICD-10-CM

## 2017-04-20 DIAGNOSIS — M5416 Radiculopathy, lumbar region: Secondary | ICD-10-CM | POA: Diagnosis not present

## 2017-04-27 ENCOUNTER — Ambulatory Visit
Admission: RE | Admit: 2017-04-27 | Discharge: 2017-04-27 | Disposition: A | Discharge status: Home / Self Care | Payer: PPO | Source: Ambulatory Visit | Attending: Orthopedic Surgery | Admitting: Orthopedic Surgery

## 2017-04-27 DIAGNOSIS — M5416 Radiculopathy, lumbar region: Secondary | ICD-10-CM

## 2017-04-27 DIAGNOSIS — M47817 Spondylosis without myelopathy or radiculopathy, lumbosacral region: Secondary | ICD-10-CM | POA: Diagnosis not present

## 2017-04-27 MED ORDER — METHYLPREDNISOLONE ACETATE 40 MG/ML INJ SUSP (RADIOLOG
120.0000 mg | Freq: Once | INTRAMUSCULAR | Status: AC
  Administered 2017-04-27: 120 mg via EPIDURAL

## 2017-04-27 MED ORDER — IOPAMIDOL (ISOVUE-M 200) INJECTION 41%
1.0000 mL | Freq: Once | INTRAMUSCULAR | Status: AC
  Administered 2017-04-27: 1 mL via EPIDURAL

## 2017-05-01 DIAGNOSIS — I442 Atrioventricular block, complete: Secondary | ICD-10-CM | POA: Diagnosis not present

## 2017-05-01 DIAGNOSIS — I10 Essential (primary) hypertension: Secondary | ICD-10-CM | POA: Diagnosis not present

## 2017-05-01 DIAGNOSIS — E782 Mixed hyperlipidemia: Secondary | ICD-10-CM | POA: Diagnosis not present

## 2017-05-02 DIAGNOSIS — M6281 Muscle weakness (generalized): Secondary | ICD-10-CM | POA: Diagnosis not present

## 2017-05-02 DIAGNOSIS — M2560 Stiffness of unspecified joint, not elsewhere classified: Secondary | ICD-10-CM | POA: Diagnosis not present

## 2017-05-02 DIAGNOSIS — M5416 Radiculopathy, lumbar region: Secondary | ICD-10-CM | POA: Diagnosis not present

## 2017-05-03 DIAGNOSIS — M5126 Other intervertebral disc displacement, lumbar region: Secondary | ICD-10-CM | POA: Diagnosis not present

## 2017-05-03 DIAGNOSIS — I442 Atrioventricular block, complete: Secondary | ICD-10-CM | POA: Diagnosis not present

## 2017-05-03 DIAGNOSIS — E782 Mixed hyperlipidemia: Secondary | ICD-10-CM | POA: Diagnosis not present

## 2017-05-03 DIAGNOSIS — Z6825 Body mass index (BMI) 25.0-25.9, adult: Secondary | ICD-10-CM | POA: Diagnosis not present

## 2017-05-03 DIAGNOSIS — M47816 Spondylosis without myelopathy or radiculopathy, lumbar region: Secondary | ICD-10-CM | POA: Diagnosis not present

## 2017-05-03 DIAGNOSIS — I10 Essential (primary) hypertension: Secondary | ICD-10-CM | POA: Diagnosis not present

## 2017-05-08 DIAGNOSIS — M5416 Radiculopathy, lumbar region: Secondary | ICD-10-CM | POA: Diagnosis not present

## 2017-05-10 DIAGNOSIS — M5416 Radiculopathy, lumbar region: Secondary | ICD-10-CM | POA: Diagnosis not present

## 2017-05-16 DIAGNOSIS — M5416 Radiculopathy, lumbar region: Secondary | ICD-10-CM | POA: Diagnosis not present

## 2017-05-17 DIAGNOSIS — Z85828 Personal history of other malignant neoplasm of skin: Secondary | ICD-10-CM | POA: Diagnosis not present

## 2017-05-17 DIAGNOSIS — D1801 Hemangioma of skin and subcutaneous tissue: Secondary | ICD-10-CM | POA: Diagnosis not present

## 2017-05-17 DIAGNOSIS — L570 Actinic keratosis: Secondary | ICD-10-CM | POA: Diagnosis not present

## 2017-05-17 DIAGNOSIS — L821 Other seborrheic keratosis: Secondary | ICD-10-CM | POA: Diagnosis not present

## 2017-05-18 DIAGNOSIS — M5416 Radiculopathy, lumbar region: Secondary | ICD-10-CM | POA: Diagnosis not present

## 2017-05-29 DIAGNOSIS — M5416 Radiculopathy, lumbar region: Secondary | ICD-10-CM | POA: Diagnosis not present

## 2017-05-31 DIAGNOSIS — E782 Mixed hyperlipidemia: Secondary | ICD-10-CM | POA: Diagnosis not present

## 2017-05-31 DIAGNOSIS — Z79899 Other long term (current) drug therapy: Secondary | ICD-10-CM | POA: Diagnosis not present

## 2017-05-31 DIAGNOSIS — M5416 Radiculopathy, lumbar region: Secondary | ICD-10-CM | POA: Diagnosis not present

## 2017-05-31 DIAGNOSIS — R7309 Other abnormal glucose: Secondary | ICD-10-CM | POA: Diagnosis not present

## 2017-05-31 DIAGNOSIS — R634 Abnormal weight loss: Secondary | ICD-10-CM | POA: Diagnosis not present

## 2017-05-31 DIAGNOSIS — I10 Essential (primary) hypertension: Secondary | ICD-10-CM | POA: Diagnosis not present

## 2017-05-31 DIAGNOSIS — R413 Other amnesia: Secondary | ICD-10-CM | POA: Diagnosis not present

## 2017-06-01 ENCOUNTER — Other Ambulatory Visit: Payer: Self-pay | Admitting: Internal Medicine

## 2017-06-01 DIAGNOSIS — R413 Other amnesia: Secondary | ICD-10-CM

## 2017-06-01 DIAGNOSIS — R634 Abnormal weight loss: Secondary | ICD-10-CM

## 2017-06-16 ENCOUNTER — Ambulatory Visit
Admission: RE | Admit: 2017-06-16 | Discharge: 2017-06-16 | Disposition: A | Discharge status: Home / Self Care | Payer: PPO | Source: Ambulatory Visit | Attending: Internal Medicine | Admitting: Internal Medicine

## 2017-06-16 DIAGNOSIS — R634 Abnormal weight loss: Secondary | ICD-10-CM | POA: Diagnosis not present

## 2017-06-16 DIAGNOSIS — R413 Other amnesia: Secondary | ICD-10-CM | POA: Diagnosis not present

## 2017-06-16 DIAGNOSIS — N400 Enlarged prostate without lower urinary tract symptoms: Secondary | ICD-10-CM | POA: Diagnosis not present

## 2017-06-16 MED ORDER — IOPAMIDOL (ISOVUE-300) INJECTION 61%
100.0000 mL | Freq: Once | INTRAVENOUS | Status: AC | PRN
  Administered 2017-06-16: 100 mL via INTRAVENOUS

## 2017-08-21 DIAGNOSIS — H2513 Age-related nuclear cataract, bilateral: Secondary | ICD-10-CM | POA: Diagnosis not present

## 2017-09-26 DIAGNOSIS — H35371 Puckering of macula, right eye: Secondary | ICD-10-CM | POA: Diagnosis not present

## 2017-10-13 DIAGNOSIS — H2511 Age-related nuclear cataract, right eye: Secondary | ICD-10-CM | POA: Diagnosis not present

## 2017-10-18 ENCOUNTER — Encounter: Payer: Self-pay | Admitting: *Deleted

## 2017-10-31 ENCOUNTER — Ambulatory Visit
Admission: RE | Admit: 2017-10-31 | Discharge: 2017-10-31 | Disposition: A | Discharge status: Home / Self Care | Payer: PPO | Source: Ambulatory Visit | Attending: Ophthalmology | Admitting: Ophthalmology

## 2017-10-31 ENCOUNTER — Ambulatory Visit: Payer: PPO | Admitting: Certified Registered Nurse Anesthetist

## 2017-10-31 ENCOUNTER — Other Ambulatory Visit: Payer: Self-pay

## 2017-10-31 ENCOUNTER — Encounter
Admission: RE | Disposition: A | Discharge status: Home / Self Care | Payer: Self-pay | Source: Ambulatory Visit | Attending: Ophthalmology

## 2017-10-31 DIAGNOSIS — Z950 Presence of cardiac pacemaker: Secondary | ICD-10-CM | POA: Insufficient documentation

## 2017-10-31 DIAGNOSIS — H2511 Age-related nuclear cataract, right eye: Secondary | ICD-10-CM | POA: Diagnosis not present

## 2017-10-31 DIAGNOSIS — Z85828 Personal history of other malignant neoplasm of skin: Secondary | ICD-10-CM | POA: Insufficient documentation

## 2017-10-31 DIAGNOSIS — Z79899 Other long term (current) drug therapy: Secondary | ICD-10-CM | POA: Insufficient documentation

## 2017-10-31 DIAGNOSIS — I10 Essential (primary) hypertension: Secondary | ICD-10-CM | POA: Diagnosis not present

## 2017-10-31 HISTORY — PX: CATARACT EXTRACTION W/PHACO: SHX586

## 2017-10-31 HISTORY — DX: Malignant (primary) neoplasm, unspecified: C80.1

## 2017-10-31 HISTORY — DX: Other symptoms and signs involving the musculoskeletal system: R29.898

## 2017-10-31 HISTORY — DX: Benign prostatic hyperplasia without lower urinary tract symptoms: N40.0

## 2017-10-31 HISTORY — DX: Presence of cardiac pacemaker: Z95.0

## 2017-10-31 SURGERY — PHACOEMULSIFICATION, CATARACT, WITH IOL INSERTION
Anesthesia: Monitor Anesthesia Care | Site: Eye | Laterality: Right | Wound class: "Clean "

## 2017-10-31 MED ORDER — ARMC OPHTHALMIC DILATING DROPS
OPHTHALMIC | Status: AC
  Administered 2017-10-31: 1 via OPHTHALMIC
  Filled 2017-10-31: qty 0.4

## 2017-10-31 MED ORDER — POVIDONE-IODINE 5 % OP SOLN
OPHTHALMIC | Status: AC
  Filled 2017-10-31: qty 30

## 2017-10-31 MED ORDER — LIDOCAINE HCL (PF) 4 % IJ SOLN
INTRAMUSCULAR | Status: AC
  Filled 2017-10-31: qty 5

## 2017-10-31 MED ORDER — MIDAZOLAM HCL 2 MG/2ML IJ SOLN
INTRAMUSCULAR | Status: AC
  Filled 2017-10-31: qty 2

## 2017-10-31 MED ORDER — MOXIFLOXACIN HCL 0.5 % OP SOLN
OPHTHALMIC | Status: AC
  Filled 2017-10-31: qty 3

## 2017-10-31 MED ORDER — NA CHONDROIT SULF-NA HYALURON 40-17 MG/ML IO SOLN
INTRAOCULAR | Status: AC
  Filled 2017-10-31: qty 1

## 2017-10-31 MED ORDER — MIDAZOLAM HCL 2 MG/2ML IJ SOLN
INTRAMUSCULAR | Status: DC | PRN
  Administered 2017-10-31 (×2): 1 mg via INTRAVENOUS

## 2017-10-31 MED ORDER — LIDOCAINE HCL (PF) 4 % IJ SOLN
INTRAOCULAR | Status: DC | PRN
  Administered 2017-10-31: 2 mL via OPHTHALMIC

## 2017-10-31 MED ORDER — ARMC OPHTHALMIC DILATING DROPS
1.0000 "application " | OPHTHALMIC | Status: AC
  Administered 2017-10-31 (×3): 1 via OPHTHALMIC

## 2017-10-31 MED ORDER — MOXIFLOXACIN HCL 0.5 % OP SOLN
OPHTHALMIC | Status: DC | PRN
  Administered 2017-10-31: .2 mL via OPHTHALMIC

## 2017-10-31 MED ORDER — POVIDONE-IODINE 5 % OP SOLN
OPHTHALMIC | Status: DC | PRN
  Administered 2017-10-31: 1 via OPHTHALMIC

## 2017-10-31 MED ORDER — NA CHONDROIT SULF-NA HYALURON 40-17 MG/ML IO SOLN
INTRAOCULAR | Status: DC | PRN
Start: 2017-10-31 — End: 2017-10-31
  Administered 2017-10-31: 1 mL via INTRAOCULAR

## 2017-10-31 MED ORDER — SODIUM CHLORIDE 0.9 % IV SOLN
INTRAVENOUS | Status: DC
  Administered 2017-10-31: 10:00:00 via INTRAVENOUS

## 2017-10-31 MED ORDER — CARBACHOL 0.01 % IO SOLN
INTRAOCULAR | Status: DC | PRN
  Administered 2017-10-31: .5 mL via INTRAOCULAR

## 2017-10-31 MED ORDER — EPINEPHRINE PF 1 MG/ML IJ SOLN
INTRAMUSCULAR | Status: AC
  Filled 2017-10-31: qty 1

## 2017-10-31 MED ORDER — MOXIFLOXACIN HCL 0.5 % OP SOLN: 1.0000 [drp] | OPHTHALMIC | Status: DC | PRN

## 2017-10-31 MED ORDER — EPINEPHRINE PF 1 MG/ML IJ SOLN
INTRAOCULAR | Status: DC | PRN
  Administered 2017-10-31: 1 mL via OPHTHALMIC

## 2017-10-31 SURGICAL SUPPLY — 16 items
GLOVE BIO SURGEON STRL SZ8 (GLOVE) ×3 IMPLANT
GLOVE BIOGEL M 6.5 STRL (GLOVE) ×3 IMPLANT
GLOVE SURG LX 8.0 MICRO (GLOVE) ×2
GLOVE SURG LX STRL 8.0 MICRO (GLOVE) ×1 IMPLANT
GOWN STRL REUS W/ TWL LRG LVL3 (GOWN DISPOSABLE) ×2 IMPLANT
GOWN STRL REUS W/TWL LRG LVL3 (GOWN DISPOSABLE) ×4
LABEL CATARACT MEDS ST (LABEL) ×3 IMPLANT
LENS IOL TECNIS ITEC 15.0 (Intraocular Lens) ×2 IMPLANT
PACK CATARACT (MISCELLANEOUS) ×3 IMPLANT
PACK CATARACT BRASINGTON LX (MISCELLANEOUS) ×3 IMPLANT
PACK EYE AFTER SURG (MISCELLANEOUS) ×3 IMPLANT
SOL BSS BAG (MISCELLANEOUS) ×3
SOLUTION BSS BAG (MISCELLANEOUS) ×1 IMPLANT
SYR 5ML LL (SYRINGE) ×3 IMPLANT
WATER STERILE IRR 250ML POUR (IV SOLUTION) ×3 IMPLANT
WIPE NON LINTING 3.25X3.25 (MISCELLANEOUS) ×3 IMPLANT

## 2017-10-31 NOTE — H&P (Signed)
All labs reviewed. Abnormal studies sent to patients PCP when indicated.  Previous H&P reviewed, patient examined, there are NO CHANGES.  Cody Vanlue Porfilio5/21/201910:39 AM

## 2017-10-31 NOTE — Transfer of Care (Signed)
Immediate Anesthesia Transfer of Care Note  Patient: Cody Gonzales  Procedure(s) Performed: CATARACT EXTRACTION PHACO AND INTRAOCULAR LENS PLACEMENT (IOC) (Right Eye)  Patient Location: Short Stay  Anesthesia Type:MAC  Level of Consciousness: awake, alert , oriented and patient cooperative  Airway & Oxygen Therapy: Patient Spontanous Breathing  Post-op Assessment: Report given to RN and Post -op Vital signs reviewed and stable  Post vital signs: Reviewed and stable  Last Vitals:  Vitals Value Taken Time  BP    Temp    Pulse    Resp    SpO2      Last Pain:  Vitals:   10/31/17 0939  TempSrc: Temporal  PainSc: 0-No pain         Complications: No apparent anesthesia complications

## 2017-10-31 NOTE — Discharge Instructions (Signed)
Eye Surgery Discharge Instructions  Expect mild scratchy sensation or mild soreness. DO NOT RUB YOUR EYE!  The day of surgery:  Minimal physical activity, but bed rest is not required  No reading, computer work, or close hand work  No bending, lifting, or straining.  May watch TV  For 24 hours:  No driving, legal decisions, or alcoholic beverages  Safety precautions  Eat anything you prefer: It is better to start with liquids, then soup then solid foods.  _____ Eye patch should be worn until postoperative exam tomorrow.  ____ Solar shield eyeglasses should be worn for comfort in the sunlight/patch while sleeping  Resume all regular medications including aspirin or Coumadin if these were discontinued prior to surgery. You may shower, bathe, shave, or wash your hair. Tylenol may be taken for mild discomfort.  Call your doctor if you experience significant pain, nausea, or vomiting, fever > 101 or other signs of infection. [phone] or [phone] Specific instructions:  Follow-up Information    Birder Robson, MD Follow up.   Specialty:  Ophthalmology Why:  May 22 at 2:15pm Contact information: [address] [place] [address] [phone]

## 2017-10-31 NOTE — Anesthesia Postprocedure Evaluation (Signed)
Anesthesia Post Note  Patient: Cody Gonzales  Procedure(s) Performed: CATARACT EXTRACTION PHACO AND INTRAOCULAR LENS PLACEMENT ([place]) (Right Eye)  Patient location during evaluation: Short Stay Anesthesia Type: MAC Level of consciousness: awake and alert and patient cooperative Pain management: pain level controlled Vital Signs Assessment: post-procedure vital signs reviewed and stable Respiratory status: spontaneous breathing and respiratory function stable Cardiovascular status: blood pressure returned to baseline Postop Assessment: no headache and no apparent nausea or vomiting Anesthetic complications: no     Last Vitals:  Vitals:   10/31/17 1101 10/31/17 1116  BP: 118/73 126/85  Pulse: 61   Resp: 18   Temp: (!) 36.2 C   SpO2: 100% 100%    Last Pain:  Vitals:   10/31/17 1101  TempSrc:   PainSc: 0-No pain                 Eben Burow

## 2017-10-31 NOTE — Anesthesia Preprocedure Evaluation (Signed)
Anesthesia Evaluation  Patient identified by MRN, date of birth, ID band Patient awake    Reviewed: Allergy & Precautions, H&P , NPO status , Patient's Chart, lab work & pertinent test results, reviewed documented beta blocker date and time   Airway Mallampati: II  TM Distance: >3 FB Neck ROM: full    Dental no notable dental hx. (+) Teeth Intact   Pulmonary neg pulmonary ROS,    Pulmonary exam normal breath sounds clear to auscultation       Cardiovascular Exercise Tolerance: Good hypertension, On Medications negative cardio ROS  + dysrhythmias + pacemaker  Rhythm:regular Rate:Normal     Neuro/Psych negative neurological ROS  negative psych ROS   GI/Hepatic negative GI ROS, Neg liver ROS,   Endo/Other  negative endocrine ROSdiabetes  Renal/GU      Musculoskeletal   Abdominal   Peds  Hematology negative hematology ROS (+)   Anesthesia Other Findings   Reproductive/Obstetrics negative OB ROS                             Anesthesia Physical Anesthesia Plan  ASA: II  Anesthesia Plan: MAC   Post-op Pain Management:    Induction:   PONV Risk Score and Plan:   Airway Management Planned:   Additional Equipment:   Intra-op Plan:   Post-operative Plan:   Informed Consent: I have reviewed the patients History and Physical, chart, labs and discussed the procedure including the risks, benefits and alternatives for the proposed anesthesia with the patient or authorized representative who has indicated his/her understanding and acceptance.     Plan Discussed with: CRNA  Anesthesia Plan Comments:         Anesthesia Quick Evaluation

## 2017-10-31 NOTE — Op Note (Signed)
PREOPERATIVE DIAGNOSIS:  Nuclear sclerotic cataract of the right eye.   POSTOPERATIVE DIAGNOSIS:  nuclear sclerotic cataract right eye   OPERATIVE PROCEDURE: Procedure(s): CATARACT EXTRACTION PHACO AND INTRAOCULAR LENS PLACEMENT (IOC)   SURGEON:  Birder Robson, MD.   ANESTHESIA:  Anesthesiologist: Molli Barrows, MD CRNA: Eben Burow, CRNA  1.      Managed anesthesia care. 2.      0.88ml of Shugarcaine was instilled in the eye following the paracentesis.   COMPLICATIONS:  None.   TECHNIQUE:   Stop and chop   DESCRIPTION OF PROCEDURE:  The patient was examined and consented in the preoperative holding area where the aforementioned topical anesthesia was applied to the right eye and then brought back to the Operating Room where the right eye was prepped and draped in the usual sterile ophthalmic fashion and a lid speculum was placed. A paracentesis was created with the side port blade and the anterior chamber was filled with viscoelastic. A near clear corneal incision was performed with the steel keratome. A continuous curvilinear capsulorrhexis was performed with a cystotome followed by the capsulorrhexis forceps. Hydrodissection and hydrodelineation were carried out with BSS on a blunt cannula. The lens was removed in a stop and chop  technique and the remaining cortical material was removed with the irrigation-aspiration handpiece. The capsular bag was inflated with viscoelastic and the Technis [id number]  lens was placed in the capsular bag without complication. The remaining viscoelastic was removed from the eye with the irrigation-aspiration handpiece. The wounds were hydrated. The anterior chamber was flushed with Miostat and the eye was inflated to physiologic pressure. 0.79ml of Vigamox was placed in the anterior chamber. The wounds were found to be water tight. The eye was dressed with Vigamox. The patient was given protective glasses to wear throughout the day and a shield with which to  sleep tonight. The patient was also given drops with which to begin a drop regimen today and will follow-up with me in one day. Implant Name Type Inv. Item Serial No. Manufacturer Lot No. LRB No. Used  LENS IOL DIOP 15.0 - [id number] Intraocular Lens LENS IOL DIOP 15.0 [id number] AMO  Right 1   Procedure(s) with comments: CATARACT EXTRACTION PHACO AND INTRAOCULAR LENS PLACEMENT (IOC) (Right) - [place] 00:29.9 AP% 12.9 CDE 3.85 Fluid pack lot # [id number]  Electronically signed: Birder Robson 10/31/2017 11:02 AM

## 2017-10-31 NOTE — Anesthesia Post-op Follow-up Note (Signed)
Anesthesia QCDR form completed.        

## 2017-11-01 DIAGNOSIS — I10 Essential (primary) hypertension: Secondary | ICD-10-CM | POA: Diagnosis not present

## 2017-11-01 DIAGNOSIS — I442 Atrioventricular block, complete: Secondary | ICD-10-CM | POA: Diagnosis not present

## 2017-11-01 DIAGNOSIS — E782 Mixed hyperlipidemia: Secondary | ICD-10-CM | POA: Diagnosis not present

## 2017-11-01 DIAGNOSIS — I700 Atherosclerosis of aorta: Secondary | ICD-10-CM | POA: Diagnosis not present

## 2017-12-06 DIAGNOSIS — H2512 Age-related nuclear cataract, left eye: Secondary | ICD-10-CM | POA: Diagnosis not present

## 2017-12-07 ENCOUNTER — Encounter: Payer: Self-pay | Admitting: *Deleted

## 2017-12-12 ENCOUNTER — Ambulatory Visit
Admission: RE | Admit: 2017-12-12 | Discharge: 2017-12-12 | Disposition: A | Discharge status: Home / Self Care | Payer: PPO | Source: Ambulatory Visit | Attending: Ophthalmology | Admitting: Ophthalmology

## 2017-12-12 ENCOUNTER — Encounter: Payer: Self-pay | Admitting: Emergency Medicine

## 2017-12-12 ENCOUNTER — Encounter
Admission: RE | Disposition: A | Discharge status: Home / Self Care | Payer: Self-pay | Source: Ambulatory Visit | Attending: Ophthalmology

## 2017-12-12 ENCOUNTER — Ambulatory Visit: Payer: PPO | Admitting: Anesthesiology

## 2017-12-12 DIAGNOSIS — Z950 Presence of cardiac pacemaker: Secondary | ICD-10-CM | POA: Diagnosis not present

## 2017-12-12 DIAGNOSIS — N400 Enlarged prostate without lower urinary tract symptoms: Secondary | ICD-10-CM | POA: Insufficient documentation

## 2017-12-12 DIAGNOSIS — E1136 Type 2 diabetes mellitus with diabetic cataract: Secondary | ICD-10-CM | POA: Diagnosis not present

## 2017-12-12 DIAGNOSIS — Z85828 Personal history of other malignant neoplasm of skin: Secondary | ICD-10-CM | POA: Diagnosis not present

## 2017-12-12 DIAGNOSIS — Z79899 Other long term (current) drug therapy: Secondary | ICD-10-CM | POA: Diagnosis not present

## 2017-12-12 DIAGNOSIS — I10 Essential (primary) hypertension: Secondary | ICD-10-CM | POA: Diagnosis not present

## 2017-12-12 DIAGNOSIS — H2512 Age-related nuclear cataract, left eye: Secondary | ICD-10-CM | POA: Insufficient documentation

## 2017-12-12 HISTORY — PX: CATARACT EXTRACTION W/PHACO: SHX586

## 2017-12-12 SURGERY — PHACOEMULSIFICATION, CATARACT, WITH IOL INSERTION
Anesthesia: Monitor Anesthesia Care | Site: Eye | Laterality: Left | Wound class: "Clean "

## 2017-12-12 MED ORDER — MIDAZOLAM HCL 2 MG/2ML IJ SOLN
INTRAMUSCULAR | Status: AC
  Filled 2017-12-12: qty 2

## 2017-12-12 MED ORDER — ARMC OPHTHALMIC DILATING DROPS
OPHTHALMIC | Status: AC
  Administered 2017-12-12: 1 via OPHTHALMIC
  Filled 2017-12-12: qty 0.4

## 2017-12-12 MED ORDER — LIDOCAINE HCL (PF) 4 % IJ SOLN
INTRAOCULAR | Status: DC | PRN
  Administered 2017-12-12: 2 mL via OPHTHALMIC

## 2017-12-12 MED ORDER — MIDAZOLAM HCL 5 MG/ML IJ SOLN
INTRAMUSCULAR | Status: DC | PRN
  Administered 2017-12-12 (×2): 1 mg via INTRAVENOUS

## 2017-12-12 MED ORDER — MOXIFLOXACIN HCL 0.5 % OP SOLN
OPHTHALMIC | Status: AC
  Filled 2017-12-12: qty 3

## 2017-12-12 MED ORDER — EPINEPHRINE PF 1 MG/ML IJ SOLN
INTRAOCULAR | Status: DC | PRN
  Administered 2017-12-12: 1 mL via OPHTHALMIC

## 2017-12-12 MED ORDER — POVIDONE-IODINE 5 % OP SOLN
OPHTHALMIC | Status: DC | PRN
  Administered 2017-12-12: 1 via OPHTHALMIC

## 2017-12-12 MED ORDER — EPINEPHRINE PF 1 MG/ML IJ SOLN
INTRAMUSCULAR | Status: AC
  Filled 2017-12-12: qty 1

## 2017-12-12 MED ORDER — LIDOCAINE HCL (PF) 4 % IJ SOLN
INTRAMUSCULAR | Status: AC
  Filled 2017-12-12: qty 5

## 2017-12-12 MED ORDER — NA CHONDROIT SULF-NA HYALURON 40-17 MG/ML IO SOLN
INTRAOCULAR | Status: AC
  Filled 2017-12-12: qty 1

## 2017-12-12 MED ORDER — NA CHONDROIT SULF-NA HYALURON 40-17 MG/ML IO SOLN
INTRAOCULAR | Status: DC | PRN
  Administered 2017-12-12: 1 mL via INTRAOCULAR

## 2017-12-12 MED ORDER — SODIUM CHLORIDE 0.9 % IV SOLN
INTRAVENOUS | Status: DC
  Administered 2017-12-12: 10:00:00 via INTRAVENOUS

## 2017-12-12 MED ORDER — MOXIFLOXACIN HCL 0.5 % OP SOLN
OPHTHALMIC | Status: DC | PRN
  Administered 2017-12-12: .2 mL via OPHTHALMIC

## 2017-12-12 MED ORDER — MOXIFLOXACIN HCL 0.5 % OP SOLN: 1.0000 [drp] | OPHTHALMIC | Status: DC | PRN

## 2017-12-12 MED ORDER — CARBACHOL 0.01 % IO SOLN
INTRAOCULAR | Status: DC | PRN
  Administered 2017-12-12: .5 mL via INTRAOCULAR

## 2017-12-12 MED ORDER — POVIDONE-IODINE 5 % OP SOLN
OPHTHALMIC | Status: AC
  Filled 2017-12-12: qty 30

## 2017-12-12 MED ORDER — ARMC OPHTHALMIC DILATING DROPS
1.0000 "application " | OPHTHALMIC | Status: AC
  Administered 2017-12-12 (×3): 1 via OPHTHALMIC

## 2017-12-12 SURGICAL SUPPLY — 16 items
GLOVE BIO SURGEON STRL SZ8 (GLOVE) ×3 IMPLANT
GLOVE BIOGEL M 6.5 STRL (GLOVE) ×3 IMPLANT
GLOVE SURG LX 8.0 MICRO (GLOVE) ×2
GLOVE SURG LX STRL 8.0 MICRO (GLOVE) ×1 IMPLANT
GOWN STRL REUS W/ TWL LRG LVL3 (GOWN DISPOSABLE) ×2 IMPLANT
GOWN STRL REUS W/TWL LRG LVL3 (GOWN DISPOSABLE) ×4
LABEL CATARACT MEDS ST (LABEL) ×3 IMPLANT
LENS IOL TECNIS ITEC 16.0 (Intraocular Lens) ×2 IMPLANT
PACK CATARACT (MISCELLANEOUS) ×3 IMPLANT
PACK CATARACT BRASINGTON LX (MISCELLANEOUS) ×3 IMPLANT
PACK EYE AFTER SURG (MISCELLANEOUS) ×3 IMPLANT
SOL BSS BAG (MISCELLANEOUS) ×3
SOLUTION BSS BAG (MISCELLANEOUS) ×1 IMPLANT
SYR 5ML LL (SYRINGE) ×3 IMPLANT
WATER STERILE IRR 250ML POUR (IV SOLUTION) ×3 IMPLANT
WIPE NON LINTING 3.25X3.25 (MISCELLANEOUS) ×3 IMPLANT

## 2017-12-12 NOTE — Transfer of Care (Signed)
Immediate Anesthesia Transfer of Care Note  Patient: Cody Gonzales  Procedure(s) Performed: CATARACT EXTRACTION PHACO AND INTRAOCULAR LENS PLACEMENT (IOC) (Left Eye)  Patient Location: PACU  Anesthesia Type:MAC  Level of Consciousness: awake  Airway & Oxygen Therapy: Patient Spontanous Breathing  Post-op Assessment: Report given to RN  Post vital signs: Reviewed and stable  Last Vitals:  Vitals Value Taken Time  BP    Temp    Pulse    Resp    SpO2      Last Pain:  Vitals:   12/12/17 0931  TempSrc: Tympanic  PainSc: 0-No pain         Complications: No apparent anesthesia complications

## 2017-12-12 NOTE — Discharge Instructions (Signed)
Eye Surgery Discharge Instructions    Expect mild scratchy sensation or mild soreness. DO NOT RUB YOUR EYE!  The day of surgery:  Minimal physical activity, but bed rest is not required  No reading, computer work, or close hand work  No bending, lifting, or straining.  May watch TV  For 24 hours:  No driving, legal decisions, or alcoholic beverages  Safety precautions  Eat anything you prefer: It is better to start with liquids, then soup then solid foods.  _____ Eye patch should be worn until postoperative exam tomorrow.  ____ Solar shield eyeglasses should be worn for comfort in the sunlight/patch while sleeping  Resume all regular medications including aspirin or Coumadin if these were discontinued prior to surgery. You may shower, bathe, shave, or wash your hair. Tylenol may be taken for mild discomfort.  Call your doctor if you experience significant pain, nausea, or vomiting, fever > 101 or other signs of infection. [phone] or [phone] Specific instructions:  Follow-up Information    Birder Robson, MD Follow up.   Specialty:  Ophthalmology Why:  12/12/17 at9:40 Contact information: [address] [place] [address] [phone]          Eye Surgery Discharge Instructions    Expect mild scratchy sensation or mild soreness. DO NOT RUB YOUR EYE!  The day of surgery:  Minimal physical activity, but bed rest is not required  No reading, computer work, or close hand work  No bending, lifting, or straining.  May watch TV  For 24 hours:  No driving, legal decisions, or alcoholic beverages  Safety precautions  Eat anything you prefer: It is better to start with liquids, then soup then solid foods.  _____ Eye patch should be worn until postoperative exam tomorrow.  ____ Solar shield eyeglasses should be worn for comfort in the sunlight/patch while sleeping  Resume all regular medications including aspirin or Coumadin if these  were discontinued prior to surgery. You may shower, bathe, shave, or wash your hair. Tylenol may be taken for mild discomfort.  Call your doctor if you experience significant pain, nausea, or vomiting, fever > 101 or other signs of infection. [phone] or [phone] Specific instructions:  Follow-up Information    Birder Robson, MD Follow up.   Specialty:  Ophthalmology Why:  12/12/17 at9:40 Contact information: [place] [address] [phone]

## 2017-12-12 NOTE — H&P (Signed)
All labs reviewed. Abnormal studies sent to patients PCP when indicated.  Previous H&P reviewed, patient examined, there are NO CHANGES.  Cody Lupu Porfilio7/2/201910:28 AM

## 2017-12-12 NOTE — Anesthesia Post-op Follow-up Note (Signed)
Anesthesia QCDR form completed.        

## 2017-12-12 NOTE — Anesthesia Preprocedure Evaluation (Signed)
Anesthesia Evaluation  Patient identified by MRN, date of birth, ID band Patient awake    Reviewed: Allergy & Precautions, H&P , NPO status , Patient's Chart, lab work & pertinent test results, reviewed documented beta blocker date and time   Airway Mallampati: II  TM Distance: >3 FB Neck ROM: full    Dental no notable dental hx. (+) Teeth Intact   Pulmonary neg pulmonary ROS,    Pulmonary exam normal breath sounds clear to auscultation       Cardiovascular Exercise Tolerance: Good hypertension, On Medications negative cardio ROS  + dysrhythmias + pacemaker  Rhythm:regular Rate:Normal     Neuro/Psych negative neurological ROS  negative psych ROS   GI/Hepatic negative GI ROS, Neg liver ROS,   Endo/Other  negative endocrine ROSdiabetes  Renal/GU      Musculoskeletal   Abdominal   Peds  Hematology negative hematology ROS (+)   Anesthesia Other Findings Past Medical History: No date: Back complaints     Comment:  BULGING DISC L3,4 No date: BPH (benign prostatic hyperplasia) No date: Cancer (HCC)     Comment:  SKIN No date: Hypertension No date: Presence of permanent cardiac pacemaker     Comment:  2018 No date: Syncope   Reproductive/Obstetrics negative OB ROS                             Anesthesia Physical  Anesthesia Plan  ASA: II  Anesthesia Plan: MAC   Post-op Pain Management:    Induction:   PONV Risk Score and Plan:   Airway Management Planned:   Additional Equipment:   Intra-op Plan:   Post-operative Plan:   Informed Consent: I have reviewed the patients History and Physical, chart, labs and discussed the procedure including the risks, benefits and alternatives for the proposed anesthesia with the patient or authorized representative who has indicated his/her understanding and acceptance.     Plan Discussed with: CRNA  Anesthesia Plan Comments:          Anesthesia Quick Evaluation

## 2017-12-12 NOTE — Op Note (Signed)
PREOPERATIVE DIAGNOSIS:  Nuclear sclerotic cataract of the left eye.   POSTOPERATIVE DIAGNOSIS:  Nuclear sclerotic cataract of the left eye.   OPERATIVE PROCEDURE: Procedure(s): CATARACT EXTRACTION PHACO AND INTRAOCULAR LENS PLACEMENT (IOC)   SURGEON:  Birder Robson, MD.   ANESTHESIA:  No anesthesia staff entered.  1.      Managed anesthesia care. 2.     0.50ml of Shugarcaine was instilled following the paracentesis   COMPLICATIONS:  None.   TECHNIQUE:   Stop and chop   DESCRIPTION OF PROCEDURE:  The patient was examined and consented in the preoperative holding area where the aforementioned topical anesthesia was applied to the left eye and then brought back to the Operating Room where the left eye was prepped and draped in the usual sterile ophthalmic fashion and a lid speculum was placed. A paracentesis was created with the side port blade and the anterior chamber was filled with viscoelastic. A near clear corneal incision was performed with the steel keratome. A continuous curvilinear capsulorrhexis was performed with a cystotome followed by the capsulorrhexis forceps. Hydrodissection and hydrodelineation were carried out with BSS on a blunt cannula. The lens was removed in a stop and chop  technique and the remaining cortical material was removed with the irrigation-aspiration handpiece. The capsular bag was inflated with viscoelastic and the Technis [id number] lens was placed in the capsular bag without complication. The remaining viscoelastic was removed from the eye with the irrigation-aspiration handpiece. The wounds were hydrated. The anterior chamber was flushed with Miostat and the eye was inflated to physiologic pressure. 0.68ml Vigamox was placed in the anterior chamber. The wounds were found to be water tight. The eye was dressed with Vigamox. The patient was given protective glasses to wear throughout the day and a shield with which to sleep tonight. The patient was also given drops  with which to begin a drop regimen today and will follow-up with me in one day. Implant Name Type Inv. Item Serial No. Manufacturer Lot No. LRB No. Used  LENS IOL DIOP 16.0 - [id number] Intraocular Lens LENS IOL DIOP 16.0 [id number]  Left 1    Procedure(s) with comments: CATARACT EXTRACTION PHACO AND INTRAOCULAR LENS PLACEMENT (IOC) (Left) - [place] 00:50 AP% 16.0 CDE 8.08 Fluid pack lot # [id number]  Electronically signed: Birder Robson 12/12/2017 10:53 AM

## 2017-12-14 NOTE — Anesthesia Postprocedure Evaluation (Signed)
Anesthesia Post Note  Patient: Cody Gonzales  Procedure(s) Performed: CATARACT EXTRACTION PHACO AND INTRAOCULAR LENS PLACEMENT (IOC) (Left Eye)  Patient location during evaluation: PACU Anesthesia Type: MAC Level of consciousness: awake and alert Pain management: pain level controlled Vital Signs Assessment: post-procedure vital signs reviewed and stable Respiratory status: spontaneous breathing, nonlabored ventilation, respiratory function stable and patient connected to nasal cannula oxygen Cardiovascular status: stable and blood pressure returned to baseline Postop Assessment: no apparent nausea or vomiting Anesthetic complications: no     Last Vitals:  Vitals:   12/12/17 0931 12/12/17 1054  BP: 119/83 (!) 127/97  Pulse: 71 68  Resp: 17 16  Temp: (!) 36.3 C (!) 36.2 C  SpO2: 98% 99%    Last Pain:  Vitals:   12/13/17 0805  TempSrc:   PainSc: 0-No pain                 Martha Clan

## 2017-12-15 ENCOUNTER — Encounter: Payer: Self-pay | Admitting: Ophthalmology

## 2017-12-19 DIAGNOSIS — I442 Atrioventricular block, complete: Secondary | ICD-10-CM | POA: Diagnosis not present

## 2018-02-01 IMAGING — DX DG CHEST 1V PORT
1 series · 2 of 2 positions shown · non-contrast
Comparison: None.

CLINICAL DATA: Pacer placement

EXAM:
PORTABLE CHEST 1 VIEW

[Series 1: chest ap · 0.14mm/px · 2 of 2 slices shown]
[im 1/2]
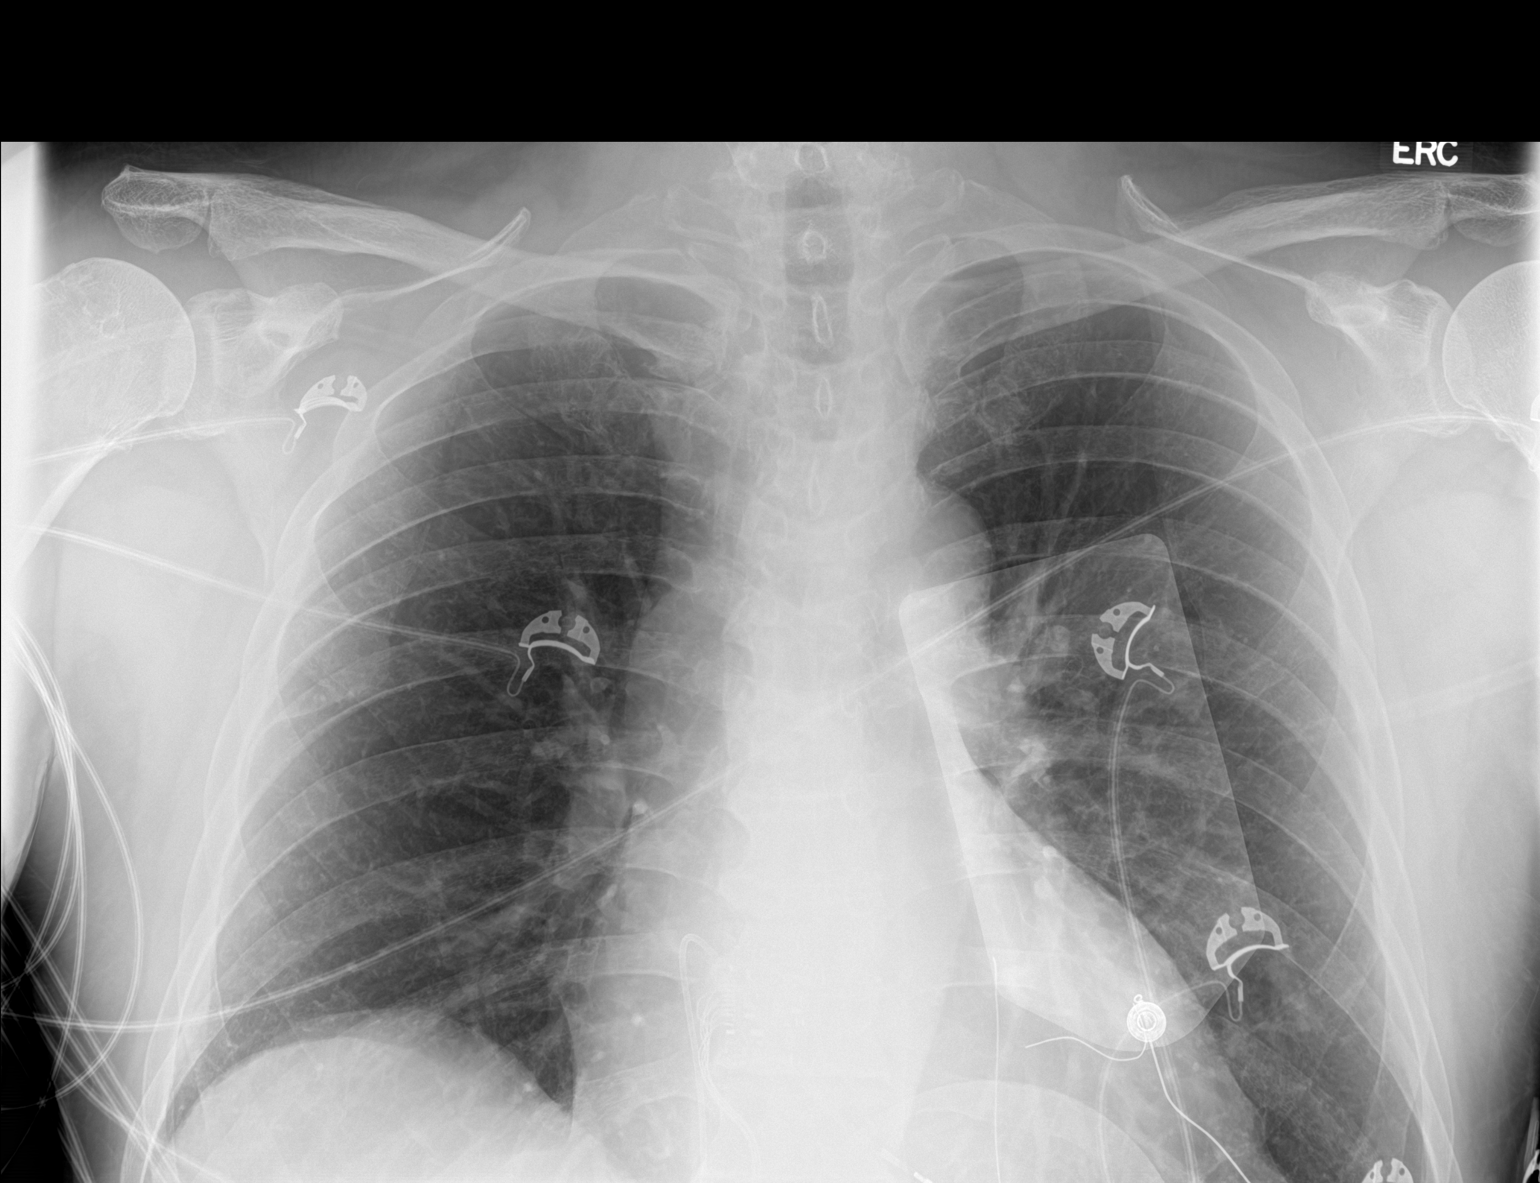
[im 2/2]
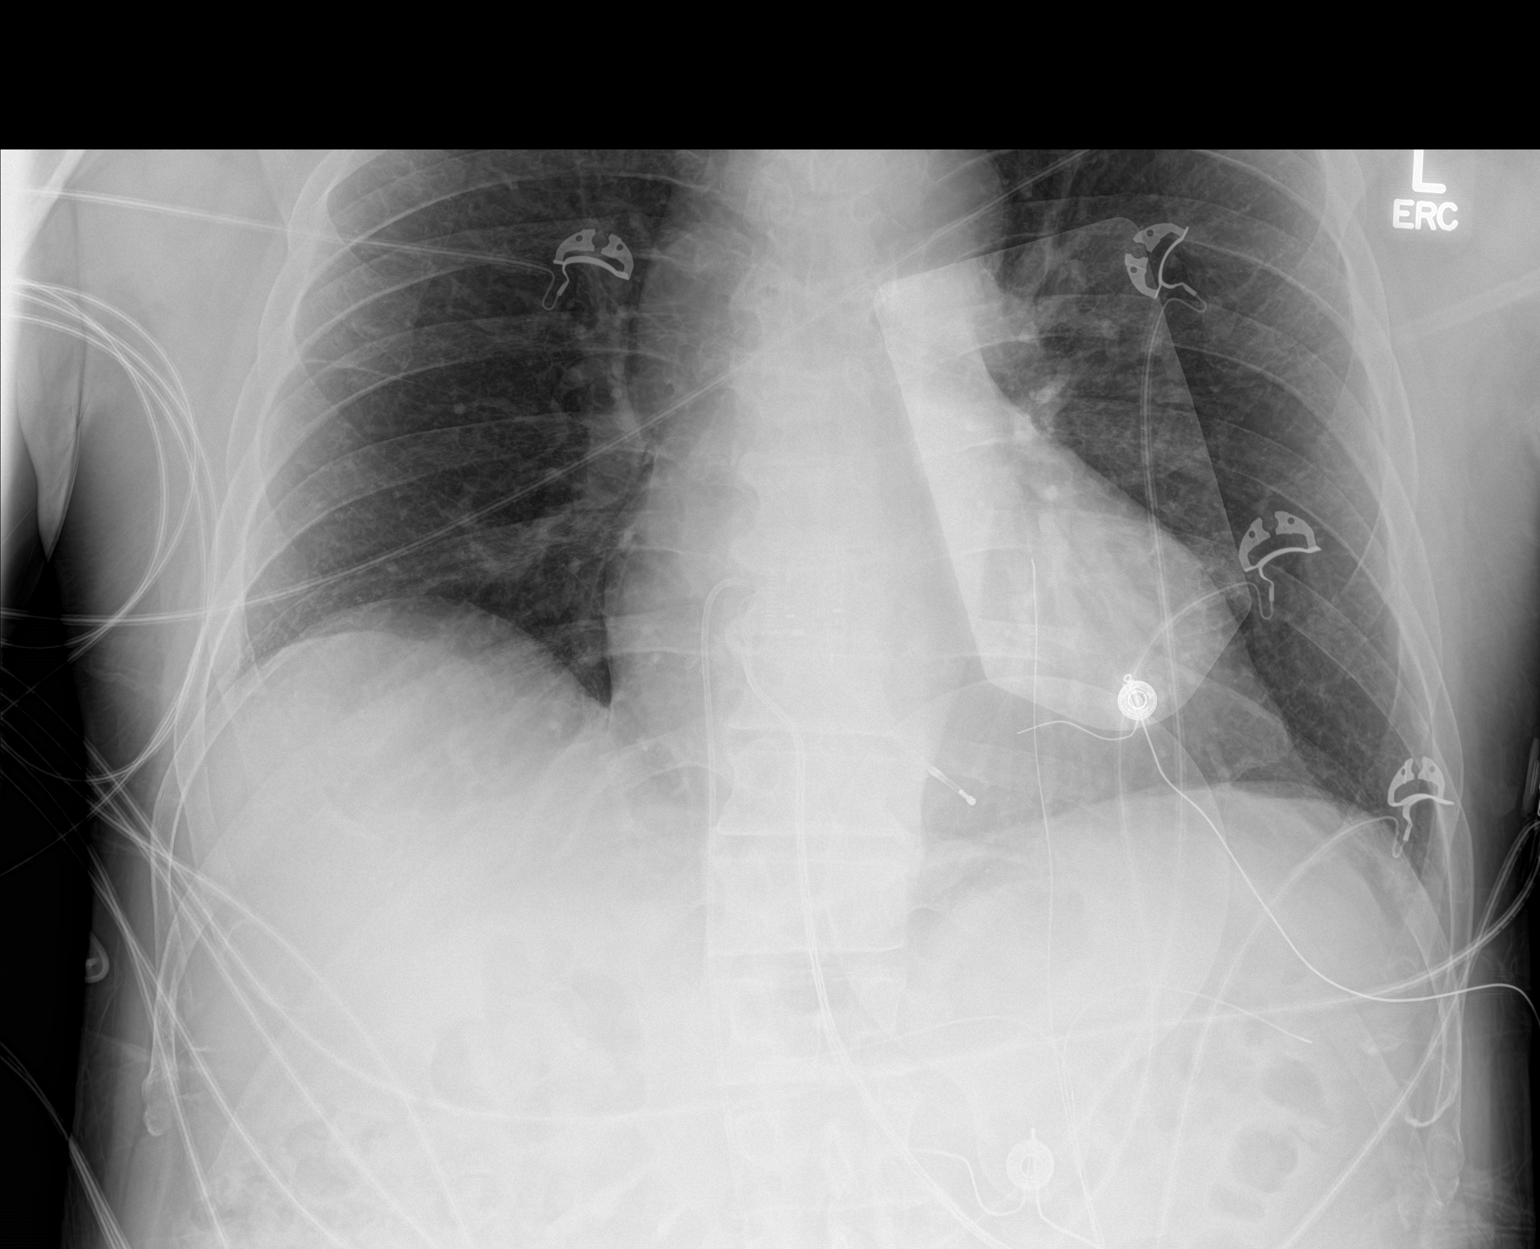

[2 of 2 positions shown; findings below may reference images not displayed]

FINDINGS: External pacing wires over the heart. No pneumothorax. No pulmonary
edema.
IMPRESSION: No pneumothorax or pulmonary edema.

## 2018-02-02 DIAGNOSIS — K648 Other hemorrhoids: Secondary | ICD-10-CM | POA: Diagnosis not present

## 2018-02-02 DIAGNOSIS — K5730 Diverticulosis of large intestine without perforation or abscess without bleeding: Secondary | ICD-10-CM | POA: Diagnosis not present

## 2018-02-02 DIAGNOSIS — Z86010 Personal history of colonic polyps: Secondary | ICD-10-CM | POA: Diagnosis not present

## 2018-02-02 DIAGNOSIS — D126 Benign neoplasm of colon, unspecified: Secondary | ICD-10-CM | POA: Diagnosis not present

## 2018-02-02 IMAGING — DX DG CHEST 1V PORT
1 series · 1 of 1 positions shown · non-contrast
Comparison: None.

CLINICAL DATA: Status post pacemaker insertion.

EXAM:
PORTABLE CHEST 1 VIEW

[chest ap]
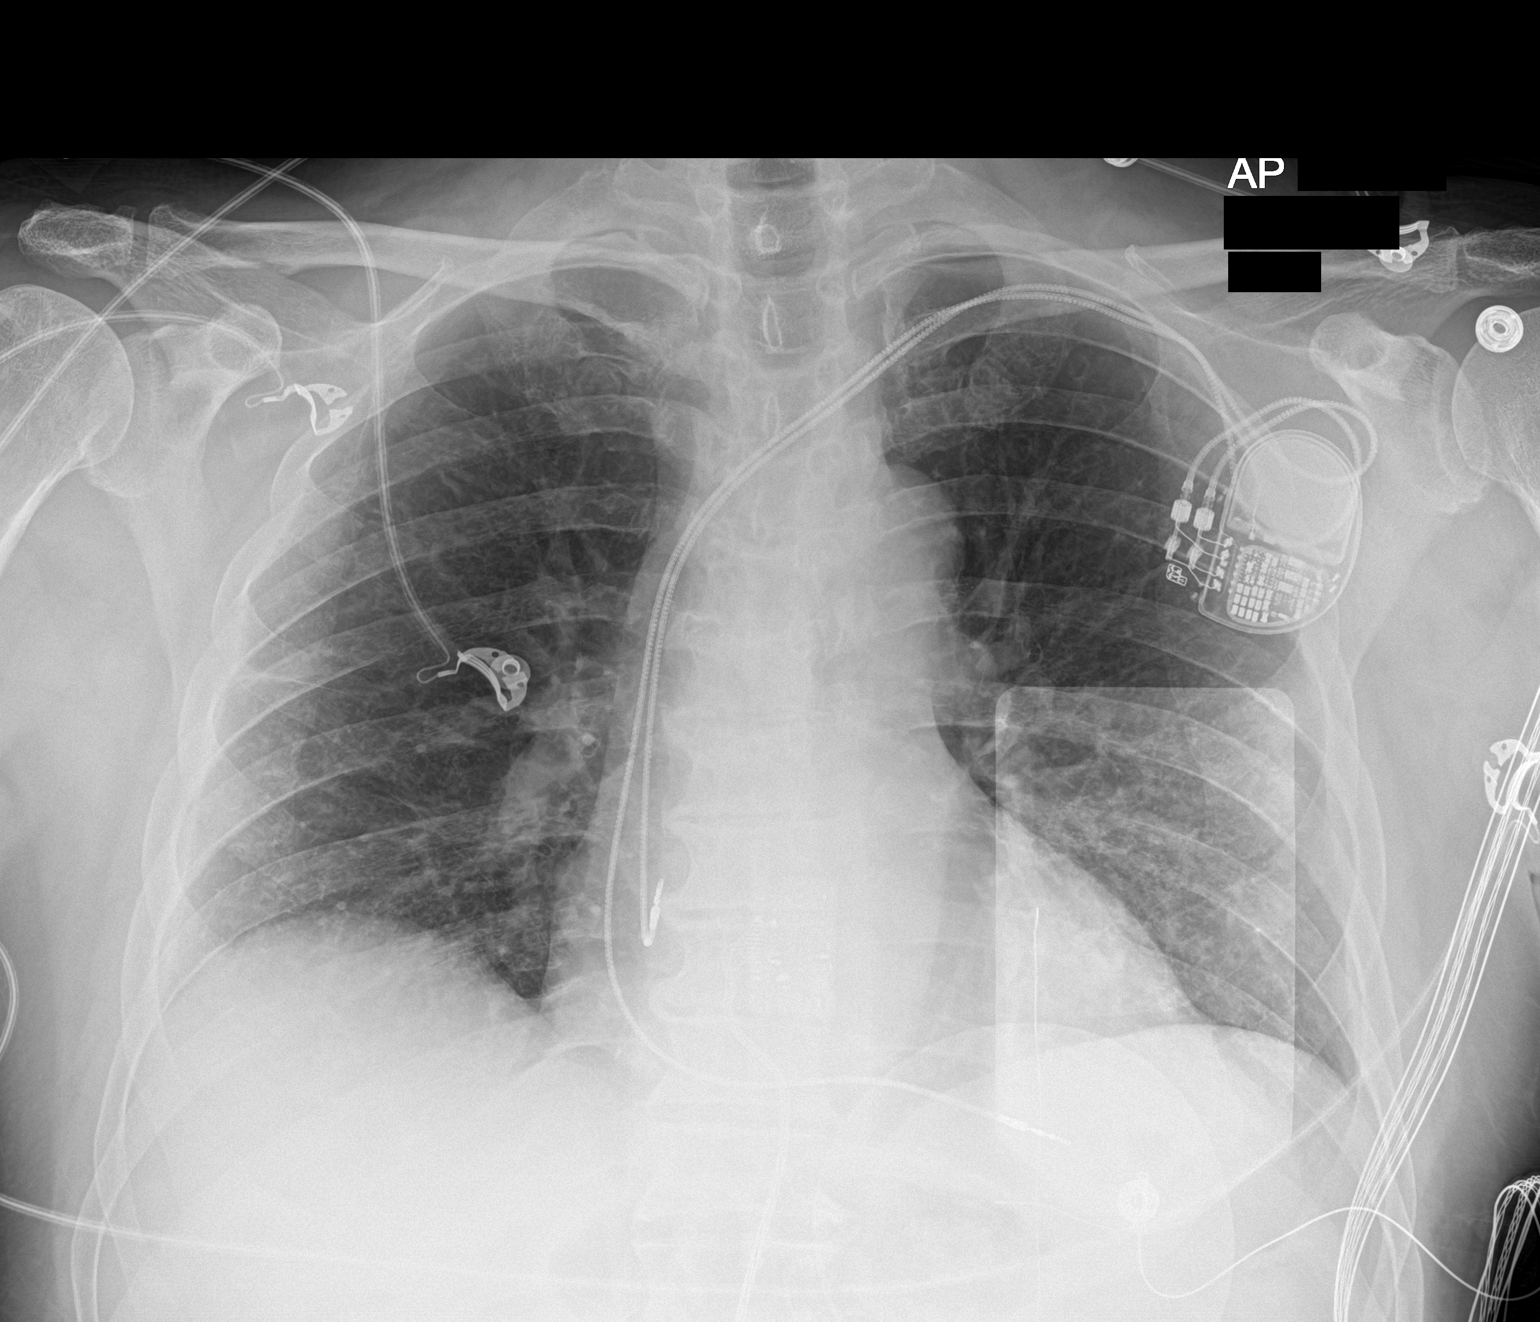

[1 of 1 positions shown; findings below may reference images not displayed]

FINDINGS: The heart size and mediastinal contours are within normal limits.
Both lungs are clear. Left-sided pacemaker is noted with leads in
grossly good position. No pneumothorax or pleural effusion is noted.
The visualized skeletal structures are unremarkable.
IMPRESSION: Status post placement of left-sided pacemaker. No pneumothorax is
noted.

## 2018-02-06 DIAGNOSIS — D126 Benign neoplasm of colon, unspecified: Secondary | ICD-10-CM | POA: Diagnosis not present

## 2018-03-14 DIAGNOSIS — D485 Neoplasm of uncertain behavior of skin: Secondary | ICD-10-CM | POA: Diagnosis not present

## 2018-03-14 DIAGNOSIS — D1801 Hemangioma of skin and subcutaneous tissue: Secondary | ICD-10-CM | POA: Diagnosis not present

## 2018-03-14 DIAGNOSIS — L820 Inflamed seborrheic keratosis: Secondary | ICD-10-CM | POA: Diagnosis not present

## 2018-03-14 DIAGNOSIS — Z85828 Personal history of other malignant neoplasm of skin: Secondary | ICD-10-CM | POA: Diagnosis not present

## 2018-03-14 DIAGNOSIS — D044 Carcinoma in situ of skin of scalp and neck: Secondary | ICD-10-CM | POA: Diagnosis not present

## 2018-03-14 DIAGNOSIS — L821 Other seborrheic keratosis: Secondary | ICD-10-CM | POA: Diagnosis not present

## 2018-03-14 DIAGNOSIS — L570 Actinic keratosis: Secondary | ICD-10-CM | POA: Diagnosis not present

## 2018-03-29 DIAGNOSIS — Z79899 Other long term (current) drug therapy: Secondary | ICD-10-CM | POA: Diagnosis not present

## 2018-03-29 DIAGNOSIS — E782 Mixed hyperlipidemia: Secondary | ICD-10-CM | POA: Diagnosis not present

## 2018-03-29 DIAGNOSIS — Z125 Encounter for screening for malignant neoplasm of prostate: Secondary | ICD-10-CM | POA: Diagnosis not present

## 2018-03-29 DIAGNOSIS — R7309 Other abnormal glucose: Secondary | ICD-10-CM | POA: Diagnosis not present

## 2018-03-29 DIAGNOSIS — I10 Essential (primary) hypertension: Secondary | ICD-10-CM | POA: Diagnosis not present

## 2018-04-03 DIAGNOSIS — E782 Mixed hyperlipidemia: Secondary | ICD-10-CM | POA: Diagnosis not present

## 2018-04-03 DIAGNOSIS — R7309 Other abnormal glucose: Secondary | ICD-10-CM | POA: Diagnosis not present

## 2018-04-03 DIAGNOSIS — Z79899 Other long term (current) drug therapy: Secondary | ICD-10-CM | POA: Diagnosis not present

## 2018-04-03 DIAGNOSIS — Z125 Encounter for screening for malignant neoplasm of prostate: Secondary | ICD-10-CM | POA: Diagnosis not present

## 2018-04-17 DIAGNOSIS — I10 Essential (primary) hypertension: Secondary | ICD-10-CM | POA: Diagnosis not present

## 2018-04-17 DIAGNOSIS — E782 Mixed hyperlipidemia: Secondary | ICD-10-CM | POA: Diagnosis not present

## 2018-05-16 DIAGNOSIS — G4730 Sleep apnea, unspecified: Secondary | ICD-10-CM | POA: Diagnosis not present

## 2018-05-16 DIAGNOSIS — I10 Essential (primary) hypertension: Secondary | ICD-10-CM | POA: Diagnosis not present

## 2018-05-16 DIAGNOSIS — E782 Mixed hyperlipidemia: Secondary | ICD-10-CM | POA: Diagnosis not present

## 2018-05-16 DIAGNOSIS — R0602 Shortness of breath: Secondary | ICD-10-CM | POA: Diagnosis not present

## 2018-05-16 DIAGNOSIS — I442 Atrioventricular block, complete: Secondary | ICD-10-CM | POA: Diagnosis not present

## 2018-05-23 DIAGNOSIS — R0602 Shortness of breath: Secondary | ICD-10-CM | POA: Diagnosis not present

## 2018-06-14 DIAGNOSIS — I442 Atrioventricular block, complete: Secondary | ICD-10-CM | POA: Diagnosis not present

## 2018-06-14 DIAGNOSIS — I10 Essential (primary) hypertension: Secondary | ICD-10-CM | POA: Diagnosis not present

## 2018-06-14 DIAGNOSIS — R0602 Shortness of breath: Secondary | ICD-10-CM | POA: Diagnosis not present

## 2018-06-14 DIAGNOSIS — E782 Mixed hyperlipidemia: Secondary | ICD-10-CM | POA: Diagnosis not present

## 2018-06-19 DIAGNOSIS — I442 Atrioventricular block, complete: Secondary | ICD-10-CM | POA: Diagnosis not present

## 2018-08-02 DIAGNOSIS — H35371 Puckering of macula, right eye: Secondary | ICD-10-CM | POA: Diagnosis not present

## 2018-10-03 DIAGNOSIS — Z1211 Encounter for screening for malignant neoplasm of colon: Secondary | ICD-10-CM | POA: Diagnosis not present

## 2018-10-03 DIAGNOSIS — R7309 Other abnormal glucose: Secondary | ICD-10-CM | POA: Diagnosis not present

## 2018-10-03 DIAGNOSIS — I442 Atrioventricular block, complete: Secondary | ICD-10-CM | POA: Diagnosis not present

## 2018-10-03 DIAGNOSIS — I10 Essential (primary) hypertension: Secondary | ICD-10-CM | POA: Diagnosis not present

## 2018-10-03 DIAGNOSIS — E782 Mixed hyperlipidemia: Secondary | ICD-10-CM | POA: Diagnosis not present

## 2018-10-03 DIAGNOSIS — Z0000 Encounter for general adult medical examination without abnormal findings: Secondary | ICD-10-CM | POA: Diagnosis not present

## 2018-10-03 DIAGNOSIS — I700 Atherosclerosis of aorta: Secondary | ICD-10-CM | POA: Diagnosis not present

## 2018-10-03 DIAGNOSIS — Z79899 Other long term (current) drug therapy: Secondary | ICD-10-CM | POA: Diagnosis not present

## 2018-10-22 DIAGNOSIS — M65321 Trigger finger, right index finger: Secondary | ICD-10-CM | POA: Diagnosis not present

## 2018-10-30 IMAGING — CT CT L SPINE W/ CM
1 of 7 series · 6 of 14 positions shown, 8 images · non-contrast
Comparison: None

CLINICAL DATA: RIGHT leg pain, concentrated at the knee. Evaluate
for lumbar compressive pathology.
TECHNIQUE: Contiguous axial images were obtained through the Lumbar spine after
the intrathecal infusion of infusion. Coronal and sagittal
reconstructions were obtained of the axial image sets.

[Series 3: l spine soft · axial · 0.32mm/px · z∈[-288,-138]mm · 6 of 71 slices shown, 8 images]
[im 11/71  soft-tissue]
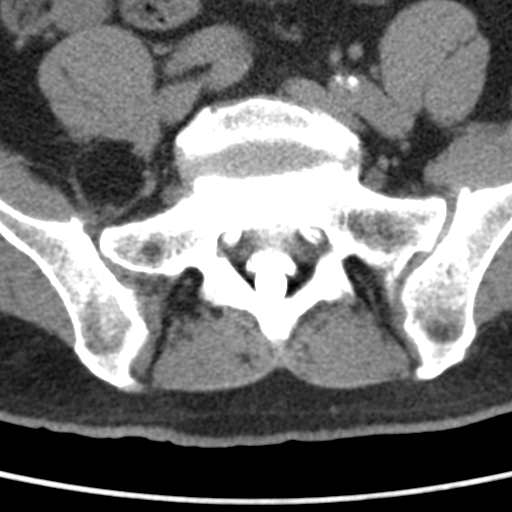
[im 11/71  bone]
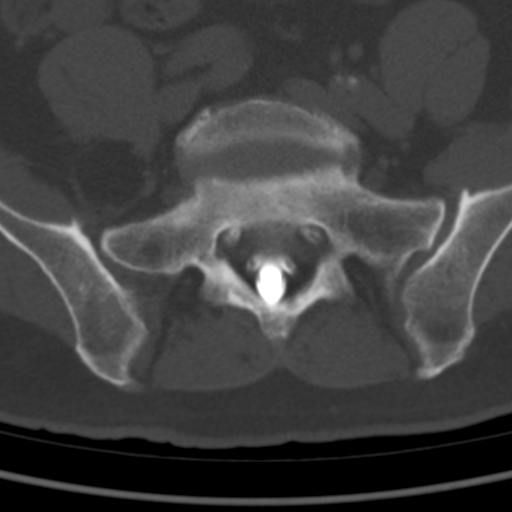
[im 21/71  bone]
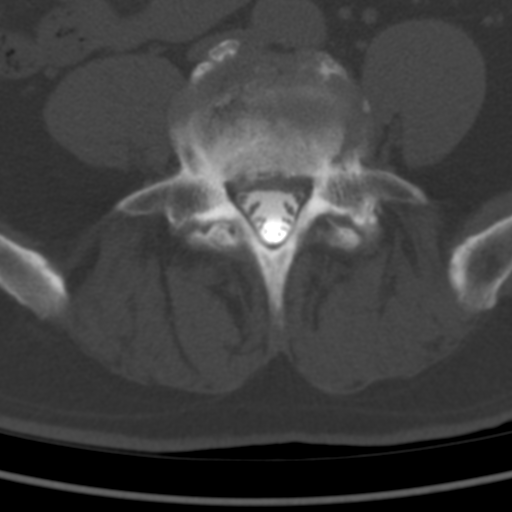
[im 31/71  bone]
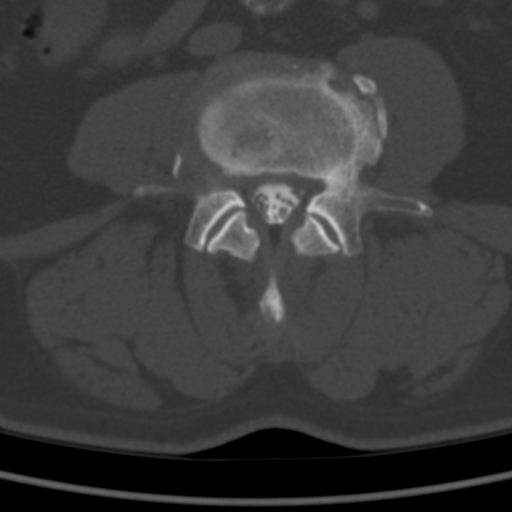
[im 41/71  bone]
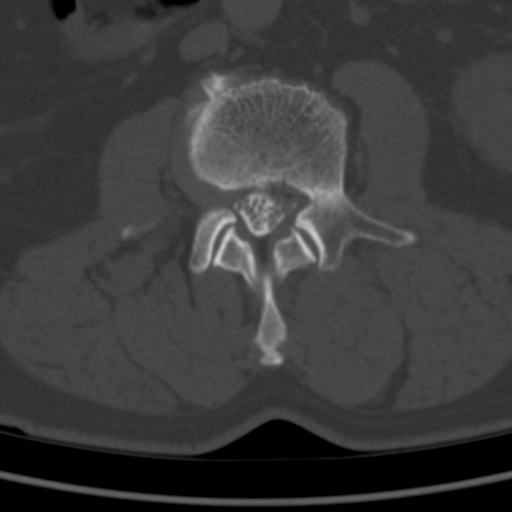
[im 51/71  soft-tissue]
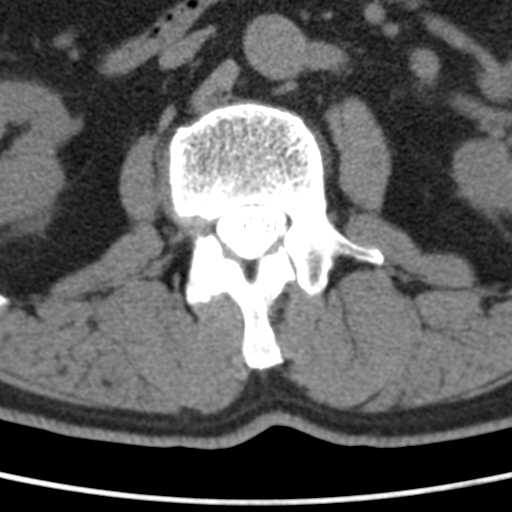
[im 51/71  bone]
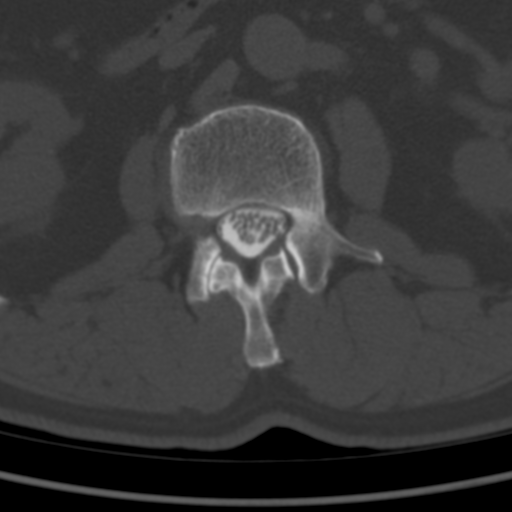
[im 61/71  bone]
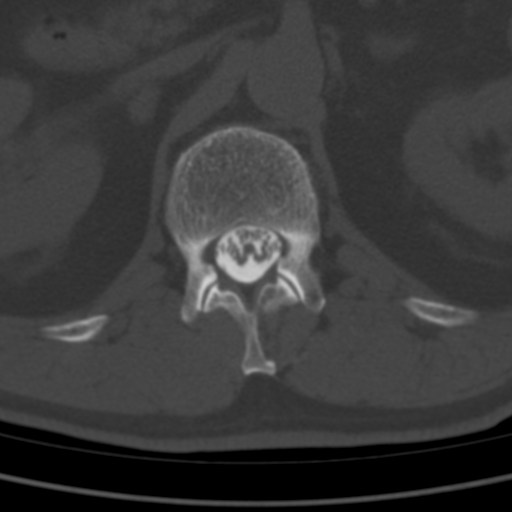

[6 of 14 positions shown; findings below may reference images not displayed]

EXAM:
LUMBAR MYELOGRAM

FLUOROSCOPY TIME:  36 seconds corresponding to a Dose Area Product
of 344.78 ?Gy*m2

PROCEDURE:
After thorough discussion of risks and benefits of the procedure
including bleeding, infection, injury to nerves, blood vessels,
adjacent structures as well as headache and CSF leak, written and
oral informed consent was obtained. Consent was obtained by Dr. Shazni
Fluks. Time out form was completed.

Patient was positioned prone on the fluoroscopy table. Local
anesthesia was provided with 1% lidocaine without epinephrine after
prepped and draped in the usual sterile fashion. Puncture was
performed at L3-4 using a 3 1/2 inch 22-gauge spinal needle via
midline approach. Using a single pass through the dura, the needle
was placed within the thecal sac, with return of clear CSF. 15 mL of
Isovue [id number] was injected into the thecal sac, with normal
opacification of the nerve roots and cauda equina consistent with
free flow within the subarachnoid space.

I personally performed the lumbar puncture and administered the
intrathecal contrast. I also personally supervised acquisition of
the myelogram images.
FINDINGS: LUMBAR MYELOGRAM FINDINGS:

Good opacification lumbar subarachnoid space. Disc space narrowing
most pronounced at L4-5. Extradural defect at L3-4 on the RIGHT.
RIGHT L4 nerve root impingement. Mild to moderate stenosis at this
level. Similar less severe stenosis at L2-L3. Ventral defects at
both L2-3 and L3-4. Shallow ventral defect at L4-5.

Anatomic alignment. Standing flexion extension demonstrates no
dynamic instability.

CT LUMBAR MYELOGRAM FINDINGS:

Segmentation: Normal.

Alignment:  Normal.

Vertebrae: No worrisome osseous lesion.

Conus medullaris: Normal in size and location.

Paraspinal tissues: No evidence for hydronephrosis or paravertebral
mass. Aortic atherosclerosis.

Disc levels:

L1-L2:  Annular bulge.  No impingement.

L2-L3: Central and leftward protrusion with vacuum phenomenon,
extends to the foramen. Minor facet disease. Mild stenosis. LEFT L2
and LEFT L3 nerve root impingement.

L3-L4: Broad-based disc protrusion, central to the RIGHT. Osseous
spurring. Partially calcified extraforaminal protrusion to the
RIGHT. BILATERAL RIGHT greater than LEFT L4 and L3 nerve root
impingement.

L4-L5: Disc space narrowing. Osseous spurring. Calcified protrusion.
Posterior element hypertrophy. No definite subarticular zone
narrowing. Mild foraminal narrowing could affect either L4 nerve
root, equally severe RIGHT versus LEFT.

L5-S1:  Central protrusion.  Facet arthropathy.  No impingement.
IMPRESSION: LUMBAR MYELOGRAM IMPRESSION:

Lumbar myelography demonstrates a dominant RIGHT-sided extradural
defect at L3-4, compressing the RIGHT L4 nerve root. Mild moderate
stenosis at L3-4 with mild stenosis at L2-3.

Advanced disc space narrowing L4-5.

No dynamic instability.

CT LUMBAR MYELOGRAM IMPRESSION:

The dominant RIGHT-sided abnormality is at L3-4 where central and
rightward protrusion, posterior element hypertrophy, along with
extraforaminal disc pathology, contribute to RIGHT greater than LEFT
L4 and L3 nerve root impingement.

Central and leftward protrusion at L2-L3. LEFT L2 and LEFT L3 nerve
root impingement.

Advanced disc space narrowing L4-5 with osseous spurring and
calcified central protrusion. No definite subarticular zone
narrowing. BILATERAL foraminal narrowing could affect either L4
nerve root.

Central protrusion L5-S1.  No impingement.

## 2018-11-20 DIAGNOSIS — Z1211 Encounter for screening for malignant neoplasm of colon: Secondary | ICD-10-CM | POA: Diagnosis not present

## 2018-12-20 DIAGNOSIS — I10 Essential (primary) hypertension: Secondary | ICD-10-CM | POA: Diagnosis not present

## 2018-12-20 DIAGNOSIS — I700 Atherosclerosis of aorta: Secondary | ICD-10-CM | POA: Diagnosis not present

## 2018-12-20 DIAGNOSIS — E782 Mixed hyperlipidemia: Secondary | ICD-10-CM | POA: Diagnosis not present

## 2018-12-20 DIAGNOSIS — I442 Atrioventricular block, complete: Secondary | ICD-10-CM | POA: Diagnosis not present

## 2018-12-28 DIAGNOSIS — I442 Atrioventricular block, complete: Secondary | ICD-10-CM | POA: Diagnosis not present

## 2019-03-20 DIAGNOSIS — L430 Hypertrophic lichen planus: Secondary | ICD-10-CM | POA: Diagnosis not present

## 2019-03-20 DIAGNOSIS — D0462 Carcinoma in situ of skin of left upper limb, including shoulder: Secondary | ICD-10-CM | POA: Diagnosis not present

## 2019-03-20 DIAGNOSIS — D485 Neoplasm of uncertain behavior of skin: Secondary | ICD-10-CM | POA: Diagnosis not present

## 2019-03-20 DIAGNOSIS — D1801 Hemangioma of skin and subcutaneous tissue: Secondary | ICD-10-CM | POA: Diagnosis not present

## 2019-03-20 DIAGNOSIS — L821 Other seborrheic keratosis: Secondary | ICD-10-CM | POA: Diagnosis not present

## 2019-03-20 DIAGNOSIS — L570 Actinic keratosis: Secondary | ICD-10-CM | POA: Diagnosis not present

## 2019-03-20 DIAGNOSIS — Z85828 Personal history of other malignant neoplasm of skin: Secondary | ICD-10-CM | POA: Diagnosis not present

## 2019-07-02 DIAGNOSIS — I442 Atrioventricular block, complete: Secondary | ICD-10-CM | POA: Diagnosis not present

## 2019-09-06 ENCOUNTER — Other Ambulatory Visit: Payer: Self-pay | Admitting: Internal Medicine

## 2019-09-06 DIAGNOSIS — Z79899 Other long term (current) drug therapy: Secondary | ICD-10-CM | POA: Diagnosis not present

## 2019-09-06 DIAGNOSIS — R413 Other amnesia: Secondary | ICD-10-CM | POA: Diagnosis not present

## 2019-09-06 DIAGNOSIS — I10 Essential (primary) hypertension: Secondary | ICD-10-CM | POA: Diagnosis not present

## 2019-09-06 DIAGNOSIS — E782 Mixed hyperlipidemia: Secondary | ICD-10-CM | POA: Diagnosis not present

## 2019-09-18 DIAGNOSIS — I10 Essential (primary) hypertension: Secondary | ICD-10-CM | POA: Diagnosis not present

## 2019-09-18 DIAGNOSIS — I700 Atherosclerosis of aorta: Secondary | ICD-10-CM | POA: Diagnosis not present

## 2019-09-18 DIAGNOSIS — I442 Atrioventricular block, complete: Secondary | ICD-10-CM | POA: Diagnosis not present

## 2019-09-18 DIAGNOSIS — E782 Mixed hyperlipidemia: Secondary | ICD-10-CM | POA: Diagnosis not present

## 2019-09-19 ENCOUNTER — Other Ambulatory Visit: Payer: Self-pay

## 2019-09-19 ENCOUNTER — Ambulatory Visit
Admission: RE | Admit: 2019-09-19 | Discharge: 2019-09-19 | Disposition: A | Discharge status: Home / Self Care | Payer: PPO | Source: Ambulatory Visit | Attending: Internal Medicine | Admitting: Internal Medicine

## 2019-09-19 DIAGNOSIS — R413 Other amnesia: Secondary | ICD-10-CM | POA: Diagnosis not present

## 2019-10-08 DIAGNOSIS — I442 Atrioventricular block, complete: Secondary | ICD-10-CM | POA: Diagnosis not present

## 2019-10-08 DIAGNOSIS — Z125 Encounter for screening for malignant neoplasm of prostate: Secondary | ICD-10-CM | POA: Diagnosis not present

## 2019-10-08 DIAGNOSIS — E782 Mixed hyperlipidemia: Secondary | ICD-10-CM | POA: Diagnosis not present

## 2019-10-08 DIAGNOSIS — Z0000 Encounter for general adult medical examination without abnormal findings: Secondary | ICD-10-CM | POA: Diagnosis not present

## 2019-10-08 DIAGNOSIS — I10 Essential (primary) hypertension: Secondary | ICD-10-CM | POA: Diagnosis not present

## 2019-10-08 DIAGNOSIS — R413 Other amnesia: Secondary | ICD-10-CM | POA: Diagnosis not present

## 2019-10-08 DIAGNOSIS — Z79899 Other long term (current) drug therapy: Secondary | ICD-10-CM | POA: Diagnosis not present

## 2019-10-08 DIAGNOSIS — R7309 Other abnormal glucose: Secondary | ICD-10-CM | POA: Diagnosis not present

## 2019-10-30 DIAGNOSIS — L570 Actinic keratosis: Secondary | ICD-10-CM | POA: Diagnosis not present

## 2019-10-30 DIAGNOSIS — C44622 Squamous cell carcinoma of skin of right upper limb, including shoulder: Secondary | ICD-10-CM | POA: Diagnosis not present

## 2019-10-30 DIAGNOSIS — L821 Other seborrheic keratosis: Secondary | ICD-10-CM | POA: Diagnosis not present

## 2019-10-30 DIAGNOSIS — Z85828 Personal history of other malignant neoplasm of skin: Secondary | ICD-10-CM | POA: Diagnosis not present

## 2019-10-30 DIAGNOSIS — D485 Neoplasm of uncertain behavior of skin: Secondary | ICD-10-CM | POA: Diagnosis not present

## 2019-10-31 DIAGNOSIS — I442 Atrioventricular block, complete: Secondary | ICD-10-CM | POA: Diagnosis not present

## 2019-10-31 DIAGNOSIS — G3184 Mild cognitive impairment, so stated: Secondary | ICD-10-CM | POA: Diagnosis not present

## 2019-10-31 DIAGNOSIS — R0683 Snoring: Secondary | ICD-10-CM | POA: Diagnosis not present

## 2019-12-13 DIAGNOSIS — G4733 Obstructive sleep apnea (adult) (pediatric): Secondary | ICD-10-CM | POA: Diagnosis not present

## 2019-12-24 DIAGNOSIS — I442 Atrioventricular block, complete: Secondary | ICD-10-CM | POA: Diagnosis not present

## 2020-01-08 DIAGNOSIS — H35371 Puckering of macula, right eye: Secondary | ICD-10-CM | POA: Diagnosis not present

## 2020-01-29 DIAGNOSIS — Z79899 Other long term (current) drug therapy: Secondary | ICD-10-CM | POA: Diagnosis not present

## 2020-01-29 DIAGNOSIS — E782 Mixed hyperlipidemia: Secondary | ICD-10-CM | POA: Diagnosis not present

## 2020-01-29 DIAGNOSIS — R7309 Other abnormal glucose: Secondary | ICD-10-CM | POA: Diagnosis not present

## 2020-01-29 DIAGNOSIS — Z125 Encounter for screening for malignant neoplasm of prostate: Secondary | ICD-10-CM | POA: Diagnosis not present

## 2020-01-31 DIAGNOSIS — Z1211 Encounter for screening for malignant neoplasm of colon: Secondary | ICD-10-CM | POA: Diagnosis not present

## 2020-01-31 DIAGNOSIS — I10 Essential (primary) hypertension: Secondary | ICD-10-CM | POA: Diagnosis not present

## 2020-01-31 DIAGNOSIS — G3184 Mild cognitive impairment, so stated: Secondary | ICD-10-CM | POA: Diagnosis not present

## 2020-01-31 DIAGNOSIS — I442 Atrioventricular block, complete: Secondary | ICD-10-CM | POA: Diagnosis not present

## 2020-01-31 DIAGNOSIS — E782 Mixed hyperlipidemia: Secondary | ICD-10-CM | POA: Diagnosis not present

## 2020-02-18 DIAGNOSIS — Z1211 Encounter for screening for malignant neoplasm of colon: Secondary | ICD-10-CM | POA: Diagnosis not present

## 2020-03-18 DIAGNOSIS — G3184 Mild cognitive impairment, so stated: Secondary | ICD-10-CM | POA: Diagnosis not present

## 2020-03-25 DIAGNOSIS — G4733 Obstructive sleep apnea (adult) (pediatric): Secondary | ICD-10-CM | POA: Diagnosis not present

## 2020-04-01 DIAGNOSIS — G4733 Obstructive sleep apnea (adult) (pediatric): Secondary | ICD-10-CM | POA: Diagnosis not present

## 2020-04-01 DIAGNOSIS — Z23 Encounter for immunization: Secondary | ICD-10-CM | POA: Diagnosis not present

## 2020-04-01 DIAGNOSIS — I10 Essential (primary) hypertension: Secondary | ICD-10-CM | POA: Diagnosis not present

## 2020-04-01 DIAGNOSIS — E782 Mixed hyperlipidemia: Secondary | ICD-10-CM | POA: Diagnosis not present

## 2020-04-01 DIAGNOSIS — I442 Atrioventricular block, complete: Secondary | ICD-10-CM | POA: Diagnosis not present

## 2020-04-01 DIAGNOSIS — I700 Atherosclerosis of aorta: Secondary | ICD-10-CM | POA: Diagnosis not present

## 2020-05-27 DIAGNOSIS — R899 Unspecified abnormal finding in specimens from other organs, systems and tissues: Secondary | ICD-10-CM | POA: Diagnosis not present

## 2020-05-27 DIAGNOSIS — I10 Essential (primary) hypertension: Secondary | ICD-10-CM | POA: Diagnosis not present

## 2020-05-27 DIAGNOSIS — E782 Mixed hyperlipidemia: Secondary | ICD-10-CM | POA: Diagnosis not present

## 2020-05-27 DIAGNOSIS — R7309 Other abnormal glucose: Secondary | ICD-10-CM | POA: Diagnosis not present

## 2020-05-27 DIAGNOSIS — G3184 Mild cognitive impairment, so stated: Secondary | ICD-10-CM | POA: Diagnosis not present

## 2020-05-27 DIAGNOSIS — Z125 Encounter for screening for malignant neoplasm of prostate: Secondary | ICD-10-CM | POA: Diagnosis not present

## 2020-05-27 DIAGNOSIS — I700 Atherosclerosis of aorta: Secondary | ICD-10-CM | POA: Diagnosis not present

## 2020-05-27 DIAGNOSIS — Z79899 Other long term (current) drug therapy: Secondary | ICD-10-CM | POA: Diagnosis not present

## 2020-06-09 DIAGNOSIS — G4733 Obstructive sleep apnea (adult) (pediatric): Secondary | ICD-10-CM | POA: Diagnosis not present

## 2020-06-25 DIAGNOSIS — G4733 Obstructive sleep apnea (adult) (pediatric): Secondary | ICD-10-CM | POA: Diagnosis not present

## 2020-06-30 DIAGNOSIS — G4733 Obstructive sleep apnea (adult) (pediatric): Secondary | ICD-10-CM | POA: Diagnosis not present

## 2020-07-09 DIAGNOSIS — D649 Anemia, unspecified: Secondary | ICD-10-CM | POA: Diagnosis not present

## 2020-07-10 DIAGNOSIS — G4733 Obstructive sleep apnea (adult) (pediatric): Secondary | ICD-10-CM | POA: Diagnosis not present

## 2020-08-03 DIAGNOSIS — G4733 Obstructive sleep apnea (adult) (pediatric): Secondary | ICD-10-CM | POA: Diagnosis not present

## 2020-08-10 DIAGNOSIS — G4733 Obstructive sleep apnea (adult) (pediatric): Secondary | ICD-10-CM | POA: Diagnosis not present

## 2020-09-07 DIAGNOSIS — G4733 Obstructive sleep apnea (adult) (pediatric): Secondary | ICD-10-CM | POA: Diagnosis not present

## 2020-09-11 DIAGNOSIS — G4733 Obstructive sleep apnea (adult) (pediatric): Secondary | ICD-10-CM | POA: Diagnosis not present

## 2020-09-11 DIAGNOSIS — I700 Atherosclerosis of aorta: Secondary | ICD-10-CM | POA: Diagnosis not present

## 2020-09-11 DIAGNOSIS — E782 Mixed hyperlipidemia: Secondary | ICD-10-CM | POA: Diagnosis not present

## 2020-09-11 DIAGNOSIS — Z950 Presence of cardiac pacemaker: Secondary | ICD-10-CM | POA: Diagnosis not present

## 2020-09-11 DIAGNOSIS — I10 Essential (primary) hypertension: Secondary | ICD-10-CM | POA: Diagnosis not present

## 2020-09-11 DIAGNOSIS — I442 Atrioventricular block, complete: Secondary | ICD-10-CM | POA: Diagnosis not present

## 2020-10-08 DIAGNOSIS — G4733 Obstructive sleep apnea (adult) (pediatric): Secondary | ICD-10-CM | POA: Diagnosis not present

## 2020-10-21 DIAGNOSIS — G4733 Obstructive sleep apnea (adult) (pediatric): Secondary | ICD-10-CM | POA: Diagnosis not present

## 2020-10-21 DIAGNOSIS — G3184 Mild cognitive impairment, so stated: Secondary | ICD-10-CM | POA: Diagnosis not present

## 2020-10-28 DIAGNOSIS — Z950 Presence of cardiac pacemaker: Secondary | ICD-10-CM | POA: Diagnosis not present

## 2020-10-28 DIAGNOSIS — D0461 Carcinoma in situ of skin of right upper limb, including shoulder: Secondary | ICD-10-CM | POA: Diagnosis not present

## 2020-10-28 DIAGNOSIS — Z85828 Personal history of other malignant neoplasm of skin: Secondary | ICD-10-CM | POA: Diagnosis not present

## 2020-10-28 DIAGNOSIS — D485 Neoplasm of uncertain behavior of skin: Secondary | ICD-10-CM | POA: Diagnosis not present

## 2020-10-28 DIAGNOSIS — D1801 Hemangioma of skin and subcutaneous tissue: Secondary | ICD-10-CM | POA: Diagnosis not present

## 2020-10-28 DIAGNOSIS — G3184 Mild cognitive impairment, so stated: Secondary | ICD-10-CM | POA: Diagnosis not present

## 2020-10-28 DIAGNOSIS — Z0000 Encounter for general adult medical examination without abnormal findings: Secondary | ICD-10-CM | POA: Diagnosis not present

## 2020-10-28 DIAGNOSIS — L821 Other seborrheic keratosis: Secondary | ICD-10-CM | POA: Diagnosis not present

## 2020-10-28 DIAGNOSIS — Z125 Encounter for screening for malignant neoplasm of prostate: Secondary | ICD-10-CM | POA: Diagnosis not present

## 2020-10-28 DIAGNOSIS — R7309 Other abnormal glucose: Secondary | ICD-10-CM | POA: Diagnosis not present

## 2020-10-28 DIAGNOSIS — E782 Mixed hyperlipidemia: Secondary | ICD-10-CM | POA: Diagnosis not present

## 2020-10-28 DIAGNOSIS — L814 Other melanin hyperpigmentation: Secondary | ICD-10-CM | POA: Diagnosis not present

## 2020-10-28 DIAGNOSIS — C4442 Squamous cell carcinoma of skin of scalp and neck: Secondary | ICD-10-CM | POA: Diagnosis not present

## 2020-10-28 DIAGNOSIS — L570 Actinic keratosis: Secondary | ICD-10-CM | POA: Diagnosis not present

## 2020-10-28 DIAGNOSIS — Z79899 Other long term (current) drug therapy: Secondary | ICD-10-CM | POA: Diagnosis not present

## 2020-10-28 DIAGNOSIS — D2371 Other benign neoplasm of skin of right lower limb, including hip: Secondary | ICD-10-CM | POA: Diagnosis not present

## 2020-10-28 DIAGNOSIS — G4733 Obstructive sleep apnea (adult) (pediatric): Secondary | ICD-10-CM | POA: Diagnosis not present

## 2020-10-28 DIAGNOSIS — I10 Essential (primary) hypertension: Secondary | ICD-10-CM | POA: Diagnosis not present

## 2020-11-07 DIAGNOSIS — G4733 Obstructive sleep apnea (adult) (pediatric): Secondary | ICD-10-CM | POA: Diagnosis not present

## 2020-11-20 DIAGNOSIS — G4733 Obstructive sleep apnea (adult) (pediatric): Secondary | ICD-10-CM | POA: Diagnosis not present

## 2020-12-08 DIAGNOSIS — G4733 Obstructive sleep apnea (adult) (pediatric): Secondary | ICD-10-CM | POA: Diagnosis not present

## 2020-12-29 DIAGNOSIS — I442 Atrioventricular block, complete: Secondary | ICD-10-CM | POA: Diagnosis not present

## 2021-01-07 DIAGNOSIS — G4733 Obstructive sleep apnea (adult) (pediatric): Secondary | ICD-10-CM | POA: Diagnosis not present

## 2021-01-23 DIAGNOSIS — G4733 Obstructive sleep apnea (adult) (pediatric): Secondary | ICD-10-CM | POA: Diagnosis not present

## 2021-02-12 DIAGNOSIS — H35373 Puckering of macula, bilateral: Secondary | ICD-10-CM | POA: Diagnosis not present

## 2021-02-19 DIAGNOSIS — G4733 Obstructive sleep apnea (adult) (pediatric): Secondary | ICD-10-CM | POA: Diagnosis not present

## 2021-02-23 DIAGNOSIS — G4733 Obstructive sleep apnea (adult) (pediatric): Secondary | ICD-10-CM | POA: Diagnosis not present

## 2021-03-25 DIAGNOSIS — G4733 Obstructive sleep apnea (adult) (pediatric): Secondary | ICD-10-CM | POA: Diagnosis not present

## 2021-03-31 DIAGNOSIS — I10 Essential (primary) hypertension: Secondary | ICD-10-CM | POA: Diagnosis not present

## 2021-03-31 DIAGNOSIS — I700 Atherosclerosis of aorta: Secondary | ICD-10-CM | POA: Diagnosis not present

## 2021-03-31 DIAGNOSIS — E782 Mixed hyperlipidemia: Secondary | ICD-10-CM | POA: Diagnosis not present

## 2021-03-31 DIAGNOSIS — I442 Atrioventricular block, complete: Secondary | ICD-10-CM | POA: Diagnosis not present

## 2021-04-25 DIAGNOSIS — G4733 Obstructive sleep apnea (adult) (pediatric): Secondary | ICD-10-CM | POA: Diagnosis not present

## 2021-05-17 DIAGNOSIS — G3184 Mild cognitive impairment, so stated: Secondary | ICD-10-CM | POA: Diagnosis not present

## 2021-05-17 DIAGNOSIS — I10 Essential (primary) hypertension: Secondary | ICD-10-CM | POA: Diagnosis not present

## 2021-05-17 DIAGNOSIS — E782 Mixed hyperlipidemia: Secondary | ICD-10-CM | POA: Diagnosis not present

## 2021-05-17 DIAGNOSIS — Z950 Presence of cardiac pacemaker: Secondary | ICD-10-CM | POA: Diagnosis not present

## 2021-05-17 DIAGNOSIS — Z79899 Other long term (current) drug therapy: Secondary | ICD-10-CM | POA: Diagnosis not present

## 2021-05-17 DIAGNOSIS — R7309 Other abnormal glucose: Secondary | ICD-10-CM | POA: Diagnosis not present

## 2021-05-17 DIAGNOSIS — Z1211 Encounter for screening for malignant neoplasm of colon: Secondary | ICD-10-CM | POA: Diagnosis not present

## 2021-05-21 DIAGNOSIS — G4733 Obstructive sleep apnea (adult) (pediatric): Secondary | ICD-10-CM | POA: Diagnosis not present

## 2021-05-25 DIAGNOSIS — G4733 Obstructive sleep apnea (adult) (pediatric): Secondary | ICD-10-CM | POA: Diagnosis not present

## 2021-05-26 DIAGNOSIS — I442 Atrioventricular block, complete: Secondary | ICD-10-CM | POA: Diagnosis not present

## 2021-06-16 DIAGNOSIS — Z1211 Encounter for screening for malignant neoplasm of colon: Secondary | ICD-10-CM | POA: Diagnosis not present

## 2021-06-25 DIAGNOSIS — G4733 Obstructive sleep apnea (adult) (pediatric): Secondary | ICD-10-CM | POA: Diagnosis not present

## 2021-07-21 DIAGNOSIS — G4733 Obstructive sleep apnea (adult) (pediatric): Secondary | ICD-10-CM | POA: Diagnosis not present

## 2021-07-21 DIAGNOSIS — G3184 Mild cognitive impairment, so stated: Secondary | ICD-10-CM | POA: Diagnosis not present

## 2021-07-21 DIAGNOSIS — Z950 Presence of cardiac pacemaker: Secondary | ICD-10-CM | POA: Diagnosis not present

## 2021-07-21 DIAGNOSIS — I442 Atrioventricular block, complete: Secondary | ICD-10-CM | POA: Diagnosis not present

## 2021-08-23 DIAGNOSIS — G4733 Obstructive sleep apnea (adult) (pediatric): Secondary | ICD-10-CM | POA: Diagnosis not present

## 2021-08-24 DIAGNOSIS — G4733 Obstructive sleep apnea (adult) (pediatric): Secondary | ICD-10-CM | POA: Diagnosis not present

## 2021-08-25 DIAGNOSIS — K136 Irritative hyperplasia of oral mucosa: Secondary | ICD-10-CM | POA: Diagnosis not present

## 2021-08-25 DIAGNOSIS — K1321 Leukoplakia of oral mucosa, including tongue: Secondary | ICD-10-CM | POA: Diagnosis not present

## 2021-09-23 DIAGNOSIS — G4733 Obstructive sleep apnea (adult) (pediatric): Secondary | ICD-10-CM | POA: Diagnosis not present

## 2021-10-23 DIAGNOSIS — G4733 Obstructive sleep apnea (adult) (pediatric): Secondary | ICD-10-CM | POA: Diagnosis not present

## 2021-10-28 DIAGNOSIS — Z85828 Personal history of other malignant neoplasm of skin: Secondary | ICD-10-CM | POA: Diagnosis not present

## 2021-10-28 DIAGNOSIS — D485 Neoplasm of uncertain behavior of skin: Secondary | ICD-10-CM | POA: Diagnosis not present

## 2021-10-28 DIAGNOSIS — L821 Other seborrheic keratosis: Secondary | ICD-10-CM | POA: Diagnosis not present

## 2021-10-28 DIAGNOSIS — L570 Actinic keratosis: Secondary | ICD-10-CM | POA: Diagnosis not present

## 2021-10-28 DIAGNOSIS — L820 Inflamed seborrheic keratosis: Secondary | ICD-10-CM | POA: Diagnosis not present

## 2021-10-28 DIAGNOSIS — D1801 Hemangioma of skin and subcutaneous tissue: Secondary | ICD-10-CM | POA: Diagnosis not present

## 2021-10-28 DIAGNOSIS — D225 Melanocytic nevi of trunk: Secondary | ICD-10-CM | POA: Diagnosis not present

## 2021-10-28 DIAGNOSIS — C44629 Squamous cell carcinoma of skin of left upper limb, including shoulder: Secondary | ICD-10-CM | POA: Diagnosis not present

## 2021-11-25 DIAGNOSIS — G4733 Obstructive sleep apnea (adult) (pediatric): Secondary | ICD-10-CM | POA: Diagnosis not present

## 2021-12-03 DIAGNOSIS — R7309 Other abnormal glucose: Secondary | ICD-10-CM | POA: Diagnosis not present

## 2021-12-03 DIAGNOSIS — I700 Atherosclerosis of aorta: Secondary | ICD-10-CM | POA: Diagnosis not present

## 2021-12-03 DIAGNOSIS — M79672 Pain in left foot: Secondary | ICD-10-CM | POA: Diagnosis not present

## 2021-12-03 DIAGNOSIS — E782 Mixed hyperlipidemia: Secondary | ICD-10-CM | POA: Diagnosis not present

## 2021-12-03 DIAGNOSIS — Z125 Encounter for screening for malignant neoplasm of prostate: Secondary | ICD-10-CM | POA: Diagnosis not present

## 2021-12-03 DIAGNOSIS — Z0000 Encounter for general adult medical examination without abnormal findings: Secondary | ICD-10-CM | POA: Diagnosis not present

## 2021-12-03 DIAGNOSIS — Z950 Presence of cardiac pacemaker: Secondary | ICD-10-CM | POA: Diagnosis not present

## 2021-12-03 DIAGNOSIS — R7303 Prediabetes: Secondary | ICD-10-CM | POA: Diagnosis not present

## 2021-12-03 DIAGNOSIS — M25561 Pain in right knee: Secondary | ICD-10-CM | POA: Diagnosis not present

## 2021-12-03 DIAGNOSIS — Z79899 Other long term (current) drug therapy: Secondary | ICD-10-CM | POA: Diagnosis not present

## 2021-12-03 DIAGNOSIS — G3184 Mild cognitive impairment, so stated: Secondary | ICD-10-CM | POA: Diagnosis not present

## 2021-12-03 DIAGNOSIS — I10 Essential (primary) hypertension: Secondary | ICD-10-CM | POA: Diagnosis not present

## 2021-12-10 DIAGNOSIS — K5730 Diverticulosis of large intestine without perforation or abscess without bleeding: Secondary | ICD-10-CM | POA: Diagnosis not present

## 2021-12-10 DIAGNOSIS — Z86010 Personal history of colonic polyps: Secondary | ICD-10-CM | POA: Diagnosis not present

## 2021-12-10 DIAGNOSIS — D122 Benign neoplasm of ascending colon: Secondary | ICD-10-CM | POA: Diagnosis not present

## 2021-12-10 DIAGNOSIS — Z09 Encounter for follow-up examination after completed treatment for conditions other than malignant neoplasm: Secondary | ICD-10-CM | POA: Diagnosis not present

## 2021-12-16 DIAGNOSIS — D122 Benign neoplasm of ascending colon: Secondary | ICD-10-CM | POA: Diagnosis not present

## 2021-12-22 DIAGNOSIS — R200 Anesthesia of skin: Secondary | ICD-10-CM | POA: Diagnosis not present

## 2021-12-22 DIAGNOSIS — M25561 Pain in right knee: Secondary | ICD-10-CM | POA: Diagnosis not present

## 2021-12-22 DIAGNOSIS — R202 Paresthesia of skin: Secondary | ICD-10-CM | POA: Diagnosis not present

## 2021-12-22 DIAGNOSIS — M5136 Other intervertebral disc degeneration, lumbar region: Secondary | ICD-10-CM | POA: Diagnosis not present

## 2021-12-22 DIAGNOSIS — G8929 Other chronic pain: Secondary | ICD-10-CM | POA: Diagnosis not present

## 2021-12-29 DIAGNOSIS — I700 Atherosclerosis of aorta: Secondary | ICD-10-CM | POA: Diagnosis not present

## 2021-12-29 DIAGNOSIS — Z950 Presence of cardiac pacemaker: Secondary | ICD-10-CM | POA: Diagnosis not present

## 2021-12-29 DIAGNOSIS — E782 Mixed hyperlipidemia: Secondary | ICD-10-CM | POA: Diagnosis not present

## 2021-12-29 DIAGNOSIS — I10 Essential (primary) hypertension: Secondary | ICD-10-CM | POA: Diagnosis not present

## 2021-12-29 DIAGNOSIS — G4733 Obstructive sleep apnea (adult) (pediatric): Secondary | ICD-10-CM | POA: Diagnosis not present

## 2021-12-29 DIAGNOSIS — I442 Atrioventricular block, complete: Secondary | ICD-10-CM | POA: Diagnosis not present

## 2022-02-10 DIAGNOSIS — D3131 Benign neoplasm of right choroid: Secondary | ICD-10-CM | POA: Diagnosis not present

## 2022-02-23 DIAGNOSIS — G4733 Obstructive sleep apnea (adult) (pediatric): Secondary | ICD-10-CM | POA: Diagnosis not present

## 2022-03-02 DIAGNOSIS — D485 Neoplasm of uncertain behavior of skin: Secondary | ICD-10-CM | POA: Diagnosis not present

## 2022-03-02 DIAGNOSIS — C44622 Squamous cell carcinoma of skin of right upper limb, including shoulder: Secondary | ICD-10-CM | POA: Diagnosis not present

## 2022-03-02 DIAGNOSIS — Z85828 Personal history of other malignant neoplasm of skin: Secondary | ICD-10-CM | POA: Diagnosis not present

## 2022-03-23 DIAGNOSIS — Z950 Presence of cardiac pacemaker: Secondary | ICD-10-CM | POA: Diagnosis not present

## 2022-03-23 DIAGNOSIS — G3184 Mild cognitive impairment, so stated: Secondary | ICD-10-CM | POA: Diagnosis not present

## 2022-03-23 DIAGNOSIS — G4733 Obstructive sleep apnea (adult) (pediatric): Secondary | ICD-10-CM | POA: Diagnosis not present

## 2022-04-13 DIAGNOSIS — I442 Atrioventricular block, complete: Secondary | ICD-10-CM | POA: Diagnosis not present

## 2022-04-27 DIAGNOSIS — D225 Melanocytic nevi of trunk: Secondary | ICD-10-CM | POA: Diagnosis not present

## 2022-04-27 DIAGNOSIS — Z85828 Personal history of other malignant neoplasm of skin: Secondary | ICD-10-CM | POA: Diagnosis not present

## 2022-04-27 DIAGNOSIS — L570 Actinic keratosis: Secondary | ICD-10-CM | POA: Diagnosis not present

## 2022-04-27 DIAGNOSIS — L821 Other seborrheic keratosis: Secondary | ICD-10-CM | POA: Diagnosis not present

## 2022-04-27 DIAGNOSIS — D1801 Hemangioma of skin and subcutaneous tissue: Secondary | ICD-10-CM | POA: Diagnosis not present

## 2022-05-24 DIAGNOSIS — G4733 Obstructive sleep apnea (adult) (pediatric): Secondary | ICD-10-CM | POA: Diagnosis not present

## 2022-05-27 DIAGNOSIS — Z950 Presence of cardiac pacemaker: Secondary | ICD-10-CM | POA: Diagnosis not present

## 2022-05-27 DIAGNOSIS — G3184 Mild cognitive impairment, so stated: Secondary | ICD-10-CM | POA: Diagnosis not present

## 2022-05-27 DIAGNOSIS — R7303 Prediabetes: Secondary | ICD-10-CM | POA: Diagnosis not present

## 2022-05-27 DIAGNOSIS — I10 Essential (primary) hypertension: Secondary | ICD-10-CM | POA: Diagnosis not present

## 2022-05-27 DIAGNOSIS — E782 Mixed hyperlipidemia: Secondary | ICD-10-CM | POA: Diagnosis not present

## 2022-05-27 DIAGNOSIS — Z79899 Other long term (current) drug therapy: Secondary | ICD-10-CM | POA: Diagnosis not present

## 2022-06-16 DIAGNOSIS — D485 Neoplasm of uncertain behavior of skin: Secondary | ICD-10-CM | POA: Diagnosis not present

## 2022-06-16 DIAGNOSIS — C44622 Squamous cell carcinoma of skin of right upper limb, including shoulder: Secondary | ICD-10-CM | POA: Diagnosis not present

## 2022-06-16 DIAGNOSIS — Z85828 Personal history of other malignant neoplasm of skin: Secondary | ICD-10-CM | POA: Diagnosis not present

## 2022-08-22 DIAGNOSIS — G4733 Obstructive sleep apnea (adult) (pediatric): Secondary | ICD-10-CM | POA: Diagnosis not present

## 2022-10-25 DIAGNOSIS — I442 Atrioventricular block, complete: Secondary | ICD-10-CM | POA: Diagnosis not present

## 2022-10-26 DIAGNOSIS — D0461 Carcinoma in situ of skin of right upper limb, including shoulder: Secondary | ICD-10-CM | POA: Diagnosis not present

## 2022-10-26 DIAGNOSIS — D225 Melanocytic nevi of trunk: Secondary | ICD-10-CM | POA: Diagnosis not present

## 2022-10-26 DIAGNOSIS — Z85828 Personal history of other malignant neoplasm of skin: Secondary | ICD-10-CM | POA: Diagnosis not present

## 2022-10-26 DIAGNOSIS — L570 Actinic keratosis: Secondary | ICD-10-CM | POA: Diagnosis not present

## 2022-10-26 DIAGNOSIS — D485 Neoplasm of uncertain behavior of skin: Secondary | ICD-10-CM | POA: Diagnosis not present

## 2022-10-26 DIAGNOSIS — L821 Other seborrheic keratosis: Secondary | ICD-10-CM | POA: Diagnosis not present

## 2022-10-26 DIAGNOSIS — D045 Carcinoma in situ of skin of trunk: Secondary | ICD-10-CM | POA: Diagnosis not present

## 2022-10-26 DIAGNOSIS — D0462 Carcinoma in situ of skin of left upper limb, including shoulder: Secondary | ICD-10-CM | POA: Diagnosis not present

## 2022-11-21 DIAGNOSIS — G4733 Obstructive sleep apnea (adult) (pediatric): Secondary | ICD-10-CM | POA: Diagnosis not present

## 2022-12-20 DIAGNOSIS — G3184 Mild cognitive impairment, so stated: Secondary | ICD-10-CM | POA: Diagnosis not present

## 2022-12-20 DIAGNOSIS — G4733 Obstructive sleep apnea (adult) (pediatric): Secondary | ICD-10-CM | POA: Diagnosis not present

## 2022-12-28 DIAGNOSIS — R7303 Prediabetes: Secondary | ICD-10-CM | POA: Diagnosis not present

## 2022-12-28 DIAGNOSIS — Z950 Presence of cardiac pacemaker: Secondary | ICD-10-CM | POA: Diagnosis not present

## 2022-12-28 DIAGNOSIS — I10 Essential (primary) hypertension: Secondary | ICD-10-CM | POA: Diagnosis not present

## 2022-12-28 DIAGNOSIS — G3184 Mild cognitive impairment, so stated: Secondary | ICD-10-CM | POA: Diagnosis not present

## 2022-12-28 DIAGNOSIS — E782 Mixed hyperlipidemia: Secondary | ICD-10-CM | POA: Diagnosis not present

## 2022-12-28 DIAGNOSIS — Z125 Encounter for screening for malignant neoplasm of prostate: Secondary | ICD-10-CM | POA: Diagnosis not present

## 2022-12-28 DIAGNOSIS — Z0000 Encounter for general adult medical examination without abnormal findings: Secondary | ICD-10-CM | POA: Diagnosis not present

## 2022-12-28 DIAGNOSIS — Z1211 Encounter for screening for malignant neoplasm of colon: Secondary | ICD-10-CM | POA: Diagnosis not present

## 2022-12-28 DIAGNOSIS — I700 Atherosclerosis of aorta: Secondary | ICD-10-CM | POA: Diagnosis not present

## 2022-12-28 DIAGNOSIS — Z79899 Other long term (current) drug therapy: Secondary | ICD-10-CM | POA: Diagnosis not present

## 2023-01-25 DIAGNOSIS — I442 Atrioventricular block, complete: Secondary | ICD-10-CM | POA: Diagnosis not present

## 2023-01-25 DIAGNOSIS — I10 Essential (primary) hypertension: Secondary | ICD-10-CM | POA: Diagnosis not present

## 2023-01-25 DIAGNOSIS — E782 Mixed hyperlipidemia: Secondary | ICD-10-CM | POA: Diagnosis not present

## 2023-01-25 DIAGNOSIS — G4733 Obstructive sleep apnea (adult) (pediatric): Secondary | ICD-10-CM | POA: Diagnosis not present

## 2023-01-25 DIAGNOSIS — I700 Atherosclerosis of aorta: Secondary | ICD-10-CM | POA: Diagnosis not present

## 2023-01-25 DIAGNOSIS — Z950 Presence of cardiac pacemaker: Secondary | ICD-10-CM | POA: Diagnosis not present

## 2023-02-20 DIAGNOSIS — G4733 Obstructive sleep apnea (adult) (pediatric): Secondary | ICD-10-CM | POA: Diagnosis not present

## 2023-03-17 DIAGNOSIS — D3131 Benign neoplasm of right choroid: Secondary | ICD-10-CM | POA: Diagnosis not present

## 2023-03-17 DIAGNOSIS — Z961 Presence of intraocular lens: Secondary | ICD-10-CM | POA: Diagnosis not present

## 2023-03-17 DIAGNOSIS — H26493 Other secondary cataract, bilateral: Secondary | ICD-10-CM | POA: Diagnosis not present

## 2023-03-17 DIAGNOSIS — H43813 Vitreous degeneration, bilateral: Secondary | ICD-10-CM | POA: Diagnosis not present

## 2023-03-22 DIAGNOSIS — I442 Atrioventricular block, complete: Secondary | ICD-10-CM | POA: Diagnosis not present

## 2023-04-05 DIAGNOSIS — R053 Chronic cough: Secondary | ICD-10-CM | POA: Diagnosis not present

## 2023-04-05 DIAGNOSIS — R7303 Prediabetes: Secondary | ICD-10-CM | POA: Diagnosis not present

## 2023-04-05 DIAGNOSIS — R5383 Other fatigue: Secondary | ICD-10-CM | POA: Diagnosis not present

## 2023-04-05 DIAGNOSIS — Z950 Presence of cardiac pacemaker: Secondary | ICD-10-CM | POA: Diagnosis not present

## 2023-04-05 DIAGNOSIS — I10 Essential (primary) hypertension: Secondary | ICD-10-CM | POA: Diagnosis not present

## 2023-04-12 DIAGNOSIS — Z79899 Other long term (current) drug therapy: Secondary | ICD-10-CM | POA: Diagnosis not present

## 2023-04-12 DIAGNOSIS — R0609 Other forms of dyspnea: Secondary | ICD-10-CM | POA: Diagnosis not present

## 2023-04-12 DIAGNOSIS — D649 Anemia, unspecified: Secondary | ICD-10-CM | POA: Diagnosis not present

## 2023-04-12 DIAGNOSIS — R5383 Other fatigue: Secondary | ICD-10-CM | POA: Diagnosis not present

## 2023-04-12 DIAGNOSIS — R531 Weakness: Secondary | ICD-10-CM | POA: Diagnosis not present

## 2023-04-12 DIAGNOSIS — R5381 Other malaise: Secondary | ICD-10-CM | POA: Diagnosis not present

## 2023-05-02 DIAGNOSIS — L570 Actinic keratosis: Secondary | ICD-10-CM | POA: Diagnosis not present

## 2023-05-02 DIAGNOSIS — D225 Melanocytic nevi of trunk: Secondary | ICD-10-CM | POA: Diagnosis not present

## 2023-05-02 DIAGNOSIS — D2262 Melanocytic nevi of left upper limb, including shoulder: Secondary | ICD-10-CM | POA: Diagnosis not present

## 2023-05-02 DIAGNOSIS — L820 Inflamed seborrheic keratosis: Secondary | ICD-10-CM | POA: Diagnosis not present

## 2023-05-02 DIAGNOSIS — Z85828 Personal history of other malignant neoplasm of skin: Secondary | ICD-10-CM | POA: Diagnosis not present

## 2023-05-02 DIAGNOSIS — D0472 Carcinoma in situ of skin of left lower limb, including hip: Secondary | ICD-10-CM | POA: Diagnosis not present

## 2023-05-02 DIAGNOSIS — D2261 Melanocytic nevi of right upper limb, including shoulder: Secondary | ICD-10-CM | POA: Diagnosis not present

## 2023-05-02 DIAGNOSIS — L821 Other seborrheic keratosis: Secondary | ICD-10-CM | POA: Diagnosis not present

## 2023-05-02 DIAGNOSIS — D485 Neoplasm of uncertain behavior of skin: Secondary | ICD-10-CM | POA: Diagnosis not present

## 2023-05-22 DIAGNOSIS — G4733 Obstructive sleep apnea (adult) (pediatric): Secondary | ICD-10-CM | POA: Diagnosis not present

## 2023-05-30 DIAGNOSIS — R0609 Other forms of dyspnea: Secondary | ICD-10-CM | POA: Diagnosis not present
